# Patient Record
Sex: Male | Born: 1990 | Race: White | Hispanic: No | Marital: Single | Smoking: Never smoker
Health system: Southern US, Community
[De-identification: ages and names within clinical notes are randomized; demographics above are authoritative.]

## PROBLEM LIST (undated history)

## (undated) DIAGNOSIS — F419 Anxiety disorder, unspecified: Secondary | ICD-10-CM

## (undated) DIAGNOSIS — F909 Attention-deficit hyperactivity disorder, unspecified type: Secondary | ICD-10-CM

## (undated) HISTORY — DX: Attention-deficit hyperactivity disorder, unspecified type: F90.9

## (undated) HISTORY — DX: Anxiety disorder, unspecified: F41.9

---
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
## 2012-06-04 ENCOUNTER — Ambulatory Visit: Payer: BC Managed Care – PPO

## 2012-06-04 ENCOUNTER — Ambulatory Visit (INDEPENDENT_AMBULATORY_CARE_PROVIDER_SITE_OTHER): Payer: BC Managed Care – PPO | Admitting: Family Medicine

## 2012-06-04 VITALS — BP 126/71 | HR 59 | Temp 98.1°F | Resp 18 | Ht 66.0 in | Wt 129.8 lb

## 2012-06-04 DIAGNOSIS — M79641 Pain in right hand: Secondary | ICD-10-CM

## 2012-06-04 DIAGNOSIS — S62319A Displaced fracture of base of unspecified metacarpal bone, initial encounter for closed fracture: Secondary | ICD-10-CM

## 2012-06-04 DIAGNOSIS — M25539 Pain in unspecified wrist: Secondary | ICD-10-CM

## 2012-06-04 DIAGNOSIS — S6291XA Unspecified fracture of right wrist and hand, initial encounter for closed fracture: Secondary | ICD-10-CM

## 2012-06-04 DIAGNOSIS — M79609 Pain in unspecified limb: Secondary | ICD-10-CM

## 2012-06-04 MED ORDER — IBUPROFEN 600 MG PO TABS
600.0000 mg | ORAL_TABLET | Freq: Three times a day (TID) | ORAL | Status: DC | PRN
Start: 2012-06-04 — End: 2012-12-08

## 2012-06-04 MED ORDER — TRAMADOL HCL 50 MG PO TABS: 50.0000 mg | ORAL_TABLET | Freq: Three times a day (TID) | ORAL | Status: DC | PRN

## 2012-06-04 NOTE — Progress Notes (Signed)
[facility]:  Office Visit  Chief Complaint:  Chief Complaint  Patient presents with  . Hand Pain    rt hand pain puched car window New Years Eve    HPI: Charles Mahoney is a right handed  22 y.o. male who complains of  A 3 day hisotry of right hand pain s/p punching a car window on New Year's Eve. Did not break any glass. Denies any  prior hand injury/wrist/surgery. Can't put weight on hand, he has weakness and decrease grip, denies numbness/tingling. Has not been able to sleep due to  throbbing pain which is constant, worse at night. He is a Consulting civil engineer at [employer], Business major for now.   History reviewed. No pertinent past medical history. History reviewed. No pertinent past surgical history. History   Social History  . Marital Status: Single    Spouse Name: N/A    Number of Children: N/A  . Years of Education: N/A   Social History Main Topics  . Smoking status: Never Smoker   . Smokeless tobacco: None  . Alcohol Use: Yes     Comment: social  . Drug Use: No  . Sexually Active: None   Other Topics Concern  . None   Social History Narrative  . None   No family history on file. No Known Allergies Prior to Admission medications   Medication Sig Start Date End Date Taking? Authorizing Provider  amphetamine-dextroamphetamine (ADDERALL XR) 15 MG 24 hr capsule Take 15 mg by mouth every morning.   Yes Historical Provider, MD     ROS: The patient denies fevers, chills, night sweats, unintentional weight loss, chest pain, palpitations, wheezing, dyspnea on exertion, nausea, vomiting, abdominal pain, dysuria, hematuria, melena, numbness, weakness, or tingling.   All other systems have been reviewed and were otherwise negative with the exception of those mentioned in the HPI and as above.    PHYSICAL EXAM: Filed Vitals:   06/04/12 1017  BP: 126/71  Pulse: 49  Temp: 98.1 F (36.7 C)  Resp: 18   Filed Vitals:   06/04/12 1017  Height: 5\' 6"  (1.676  m)  Weight: 129 lb 12.8 oz (58.877 kg)   Body mass index is 20.95 kg/(m^2).  General: Alert, no acute distress HEENT:  Normocephalic, atraumatic, oropharynx patent.  Cardiovascular:  Regular rate and rhythm, no rubs murmurs or gallops.  No Carotid bruits, radial pulse intact. No pedal edema.  Respiratory: Clear to auscultation bilaterally.  No wheezes, rales, or rhonchi.  No cyanosis, no use of accessory musculature GI: No organomegaly, abdomen is soft and non-tender, positive bowel sounds.  No masses. Skin: No rashes. Neurologic: Facial musculature symmetric. Psychiatric: Patient is appropriate throughout our interaction. Lymphatic: No cervical lymphadenopathy Musculoskeletal: Gait intact. Right hand-+ edema, ecchymosis on ulnar aspect of dorsum and volar aspect, no deformities, resolving ecchymosis, + radial pulse, decrease ROM due to pain to full flexion/extensions/abduction of 4th and 5th fingers. Sensation intact, 4/5 strength in 4/5th digits.  Right wrist exam-normal    LABS: No results found for this or any previous visit.   EKG/XRAY:   Primary read interpreted by Dr. Conley Rolls at [facility]. No wrist fractures/subluxation Displaced 5th MCP base fracture   ASSESSMENT/PLAN: Encounter Diagnoses  Name Primary?  . Wrist pain, acute Yes  . Hand pain, right   . Hand fracture, right    Displaced 5th MCP base fracture Rx Tramadol and IBuprofen Splint placed, patient declined sling Referred to [facility] Dr. Dion Saucier for Monday AM  walkin appt School note given for 2 days off 10/6 through 03/07/2013. I am not sure if he is planning to go to the ortho referral since he is a Consulting civil engineer at [employer] and he is supposed to resume classes on Monday. I advise him the improtance of getting that fracture evaluated by surgeon as soon as possible. F/u prn    Lalani Winkles PHUONG, DO 06/04/2012 11:09 AM

## 2012-12-08 ENCOUNTER — Ambulatory Visit: Payer: BC Managed Care – PPO

## 2012-12-08 ENCOUNTER — Ambulatory Visit (INDEPENDENT_AMBULATORY_CARE_PROVIDER_SITE_OTHER): Payer: BC Managed Care – PPO | Admitting: Internal Medicine

## 2012-12-08 VITALS — BP 118/70 | HR 72 | Temp 98.8°F | Resp 16 | Ht 67.0 in | Wt 130.0 lb

## 2012-12-08 DIAGNOSIS — M25521 Pain in right elbow: Secondary | ICD-10-CM

## 2012-12-08 DIAGNOSIS — M25529 Pain in unspecified elbow: Secondary | ICD-10-CM

## 2012-12-08 MED ORDER — HYDROCODONE-ACETAMINOPHEN 5-325 MG PO TABS: 1.0000 | ORAL_TABLET | Freq: Three times a day (TID) | ORAL | Status: DC | PRN

## 2012-12-08 NOTE — Progress Notes (Signed)
  Subjective:    Patient ID: Charles Mahoney, male    DOB: Apr 20, 1991, 22 y.o.   MRN: [id number]  HPI 22 year old male presents with right elbow pain s/p injury while playing basketball yesterday.  States he fell on concrete and struck the lateral aspect of his elbow in the ground.  Admits it felt ok right after the injury, but about 2 hours after it "locked up" and he was unable to fully flex or extend his elbow. Has small abrasion above injury. Since then he has continued to have pain and difficulty with ROM.  Has been wearing a sling which does help with the discomfort.  Denies paresthesias or weakness in his fingers or hand. No injury to his hand or shoulder.  No prior surgeries to the elbow, but when he was young dislocated both elbow's several times.   Patient is otherwise doing well with no other concerns today.     Review of Systems  Constitutional: Negative for fever and chills.  Musculoskeletal: Positive for joint swelling.  Skin: Positive for wound (small abrasion over lateral aspect of elbow).  Neurological: Negative for headaches.       Objective:   Physical Exam  Constitutional: He is oriented to person, place, and time. He appears well-developed and well-nourished.  HENT:  Head: Normocephalic and atraumatic.  Right Ear: External ear normal.  Left Ear: External ear normal.  Eyes: Conjunctivae are normal.  Neck: Normal range of motion.  Cardiovascular: Normal rate.   Pulmonary/Chest: Effort normal.  Musculoskeletal:       Right shoulder: Normal.       Right elbow: He exhibits decreased range of motion (unable to fully extend or flex) and swelling. He exhibits no deformity and no laceration. Tenderness found. Radial head tenderness noted. No medial epicondyle, no lateral epicondyle and no olecranon process tenderness noted.       Left elbow: Normal. Decreased range of motion: unable to fully extend or flex.       Right wrist: Normal.  Patient also has pain with supination  and pronation  Neurological: He is alert and oriented to person, place, and time.  Psychiatric: He has a normal mood and affect. His behavior is normal. Judgment and thought content normal.      [facility] reading (PRIMARY) by  Dr. Perrin Maltese as anterior and posterior fat pads present. Possible nondisplaced radial head fracture.      Assessment & Plan:  Elbow pain, right - Plan: DG Elbow Complete Right, HYDROcodone-acetaminophen (NORCO) 5-325 MG per tablet  Continue to wear sling daily for comfort Recommend Tylenol as directed for pain. May take Norco 5/325 mg at bedtime as needed RTC in 10 days to re x-ray, sooner if symptoms worsen Consider PT if needed for rehab.

## 2012-12-19 ENCOUNTER — Ambulatory Visit: Payer: BC Managed Care – PPO

## 2012-12-19 ENCOUNTER — Ambulatory Visit (INDEPENDENT_AMBULATORY_CARE_PROVIDER_SITE_OTHER): Payer: BC Managed Care – PPO | Admitting: Family Medicine

## 2012-12-19 VITALS — BP 104/66 | HR 72 | Temp 98.2°F | Resp 16 | Ht 67.0 in | Wt 132.0 lb

## 2012-12-19 DIAGNOSIS — S59901D Unspecified injury of right elbow, subsequent encounter: Secondary | ICD-10-CM

## 2012-12-19 DIAGNOSIS — M25521 Pain in right elbow: Secondary | ICD-10-CM

## 2012-12-19 DIAGNOSIS — Z5189 Encounter for other specified aftercare: Secondary | ICD-10-CM

## 2012-12-19 DIAGNOSIS — F909 Attention-deficit hyperactivity disorder, unspecified type: Secondary | ICD-10-CM | POA: Insufficient documentation

## 2012-12-19 DIAGNOSIS — R011 Cardiac murmur, unspecified: Secondary | ICD-10-CM

## 2012-12-19 DIAGNOSIS — M25529 Pain in unspecified elbow: Secondary | ICD-10-CM

## 2012-12-19 NOTE — Patient Instructions (Addendum)
Please come and see [place] in about one weeks for a recheck.  Wear your sling as needed for comfort, but be sure to do range of motion exercises several times a day.  Call your insurance company and ask them about doing an "echocardiogram" to evaluate a heart murmur.  If you would like to go ahead with this give me a call or email and I will set it up.

## 2012-12-19 NOTE — Progress Notes (Signed)
[facility] [address] [place] [address] [id number]  Date:  12/19/2012   Name:  Charles Mahoney   DOB:  November 04, 1990   MRN:  [id number]  PCP:  Nilda Simmer, MD    Chief Complaint: Elbow Injury   History of Present Illness:  Charles Mahoney is a 22 y.o. very pleasant male patient who presents with the following:  Seen here on 7/10 with a right elbow injury- he fell and hit the lateal elbow on a concrete floor.    Right elbow film from 7/10-  RIGHT ELBOW - COMPLETE 3+ VIEW  Comparison: None.  Findings: Frontal, lateral, and bilateral oblique views were  obtained. There is evidence of joint effusion. f no fracture is  visualized on this study. No dislocation. Joint spaces appear  intact.  IMPRESSION: Joint effusion. No fractures seen. The presence of  joint effusion raises concern for subtle nondisplaced fracture.  Joint effusion, however, also could have arthropathic etiology.    He was placed in a sling.   His elbow is now better, except when he tries to fully extend the elbow. He is still using his sling  There are no active problems to display for this patient.   Past Medical History  Diagnosis Date  . ADHD (attention deficit hyperactivity disorder)     No past surgical history on file.  History  Substance Use Topics  . Smoking status: Never Smoker   . Smokeless tobacco: Not on file  . Alcohol Use: Yes     Comment: social    No family history on file.  No Known Allergies  Medication list has been reviewed and updated.  Current Outpatient Prescriptions on File Prior to Visit  Medication Sig Dispense Refill  . amphetamine-dextroamphetamine (ADDERALL XR) 15 MG 24 hr capsule Take 15 mg by mouth every morning.      . clonazePAM (KLONOPIN) 1 MG tablet Take 1 mg by mouth daily.      Marland Kitchen HYDROcodone-acetaminophen (NORCO) 5-325 MG per tablet Take 1 tablet by mouth every 8 (eight) hours as needed for pain.  20 tablet  0   No current  facility-administered medications on file prior to visit.    Review of Systems:  As per HPI- otherwise negative.   Physical Examination: Filed Vitals:   12/19/12 0817  BP: 104/66  Pulse: 72  Temp: 98.2 F (36.8 C)  Resp: 16   Filed Vitals:   12/19/12 0817  Height: 5\' 7"  (1.702 m)  Weight: 132 lb (59.875 kg)   Body mass index is 20.67 kg/(m^2). Ideal Body Weight: Weight in (lb) to have BMI = 25: 159.3  GEN: WDWN, NAD, Non-toxic, A & O x 3 HEENT: Atraumatic, Normocephalic. Neck supple. No masses, No LAD. Ears and Nose: No external deformity. CV: RRR, No G/R. No JVD. No thrill. No extra heart sounds.  He does have a 2/6 systolic murmur PULM: CTA B, no wheezes, crackles, rhonchi. No retractions. No resp. distress. No accessory muscle use. EXTR: No c/c/e NEURO Normal gait.  PSYCH: Normally interactive. Conversant. Not depressed or anxious appearing.  Calm demeanor.  Right elbow: cannot fully extend elbow- can get to about 165 degrees.  He has full flexion, normal pronation and supination but he does have pain with this movement. Normal perfusion  [facility] reading (PRIMARY) by  Dr. Patsy Lager. Right elbow: compared with films from 10 days ago.  No definite fracture, but he does have a small anterior fat pad at the elbow  which could indicate a radial head fracture.    RIGHT ELBOW - COMPLETE 3+ VIEW  Comparison: 12/08/2012  Findings: Again noted is a joint effusion within the right elbow.  On today's study, there is suggestion of lucency through the base  of the coronoid process of the ulna concerning for nondisplaced  fracture. No additional suspicious abnormality.  IMPRESSION:  Continued joint effusion. Suspicion for nondisplaced coronoid  process fracture.  Clinically significant discrepancy from primary report, if  provided: Probable coronoid process fracture. Study will be made a  PRA call report.    Assessment and Plan: Pain in right elbow  Injury of right elbow,  subsequent encounter - Plan: DG Elbow Complete Right  Heart murmur  Placed back in sling and instructed to perform ROM exercises and recheck in one week. However, then received over- read report as above- possible coronoid fracture. Called pt back; he is able to see ortho today.  [facility] is able to see him today. Called pt to let him know, will fax records.  Appreciate [facility]'s care of this patient   Also note heart murmur.  He will consult his insurance company and let me know if he is able to have an echo to try and determine the cause.    Signed Abbe Amsterdam, MD

## 2012-12-30 ENCOUNTER — Telehealth: Payer: Self-pay

## 2012-12-30 DIAGNOSIS — R011 Cardiac murmur, unspecified: Secondary | ICD-10-CM

## 2012-12-30 NOTE — Telephone Encounter (Signed)
Pt states on referral vmail that he and dr copland previously talked about referring pt for ECG. Pt would like to request this referral.  bf

## 2013-01-01 NOTE — Telephone Encounter (Signed)
Received message.  Will order echo- called and let him know

## 2013-01-10 ENCOUNTER — Other Ambulatory Visit (HOSPITAL_COMMUNITY): Payer: Self-pay

## 2013-01-19 ENCOUNTER — Telehealth: Payer: Self-pay | Admitting: Family Medicine

## 2013-01-19 NOTE — Telephone Encounter (Signed)
Patient No Showed for Echocardiogram on 01-10-13.  Called patient to re-shedule with no response.  Will forward this message to Dr. Dallas Schimke.

## 2013-01-23 ENCOUNTER — Encounter: Payer: Self-pay | Admitting: Family Medicine

## 2014-02-02 IMAGING — CR DG ELBOW COMPLETE 3+V*R*
4 series · 4 of 4 positions shown · non-contrast
Comparison: None.

CLINICAL DATA: Pain occulta with motion

RIGHT ELBOW - COMPLETE 3+ VIEW

[AP]
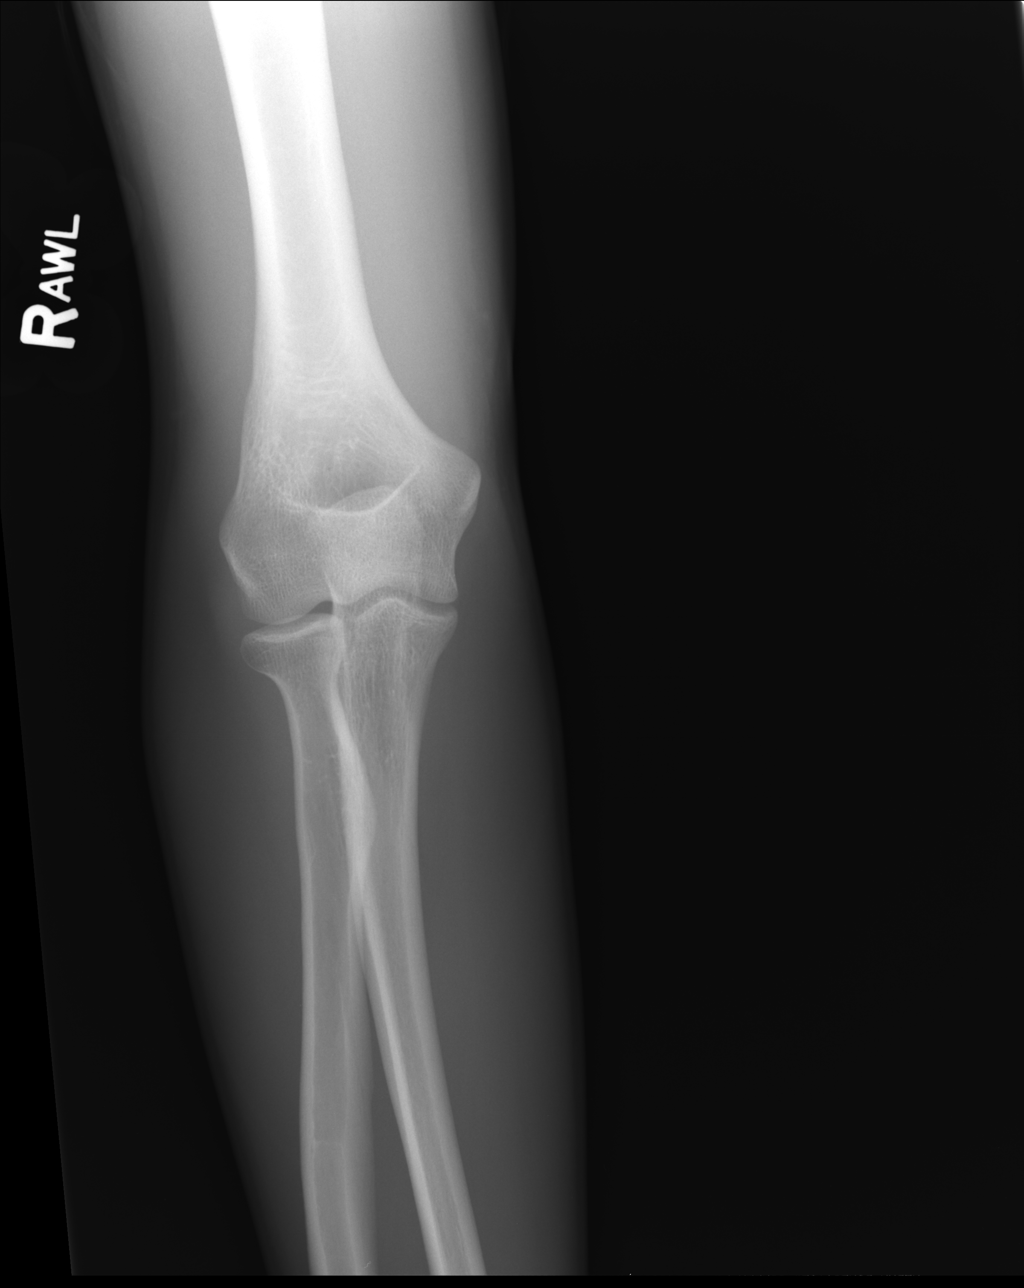

[lateral]
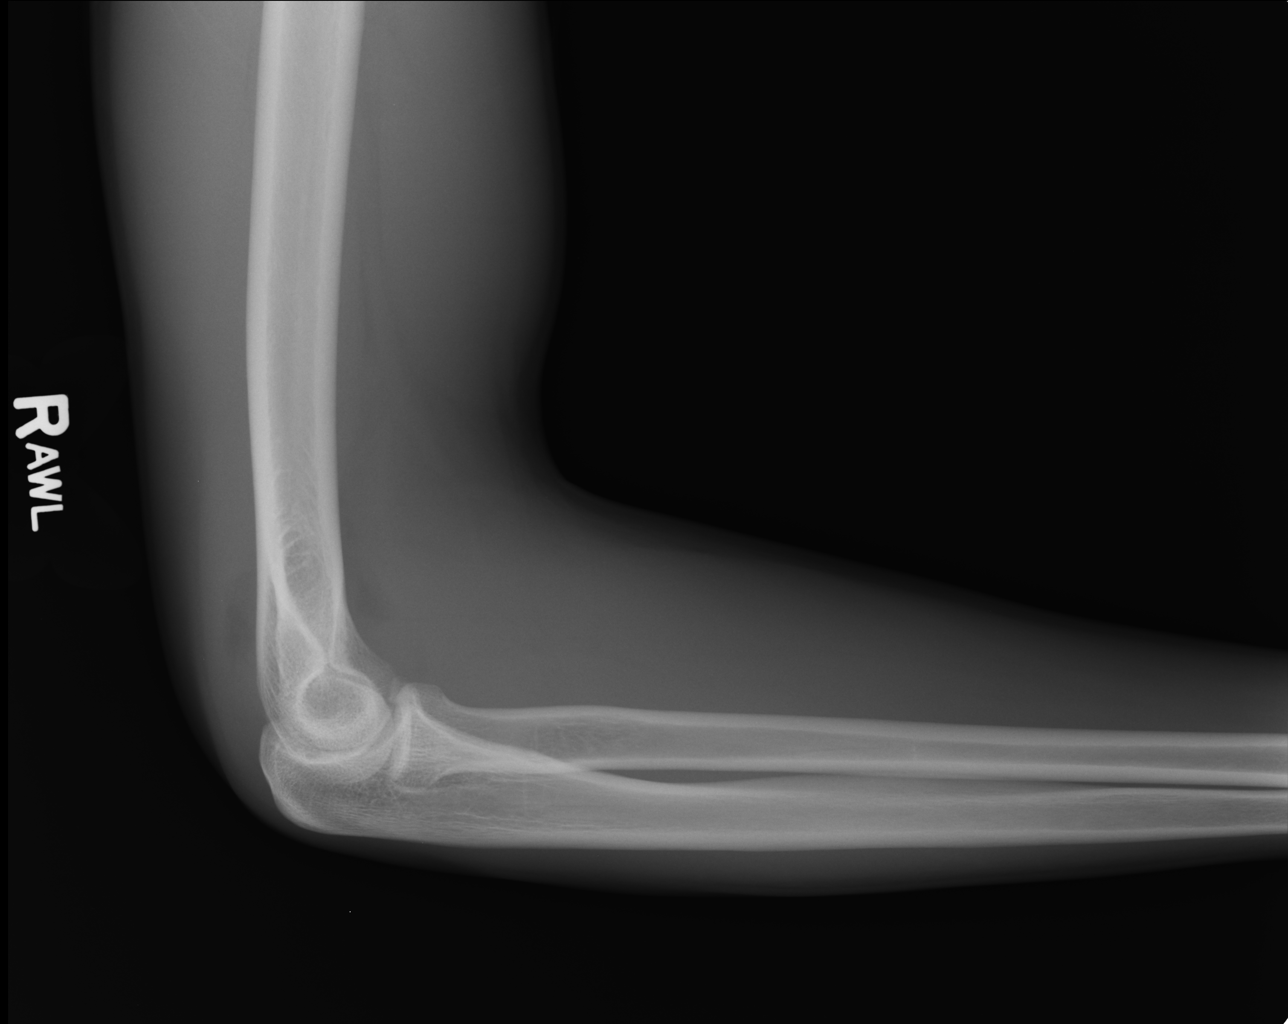

[ap obl ext rot]
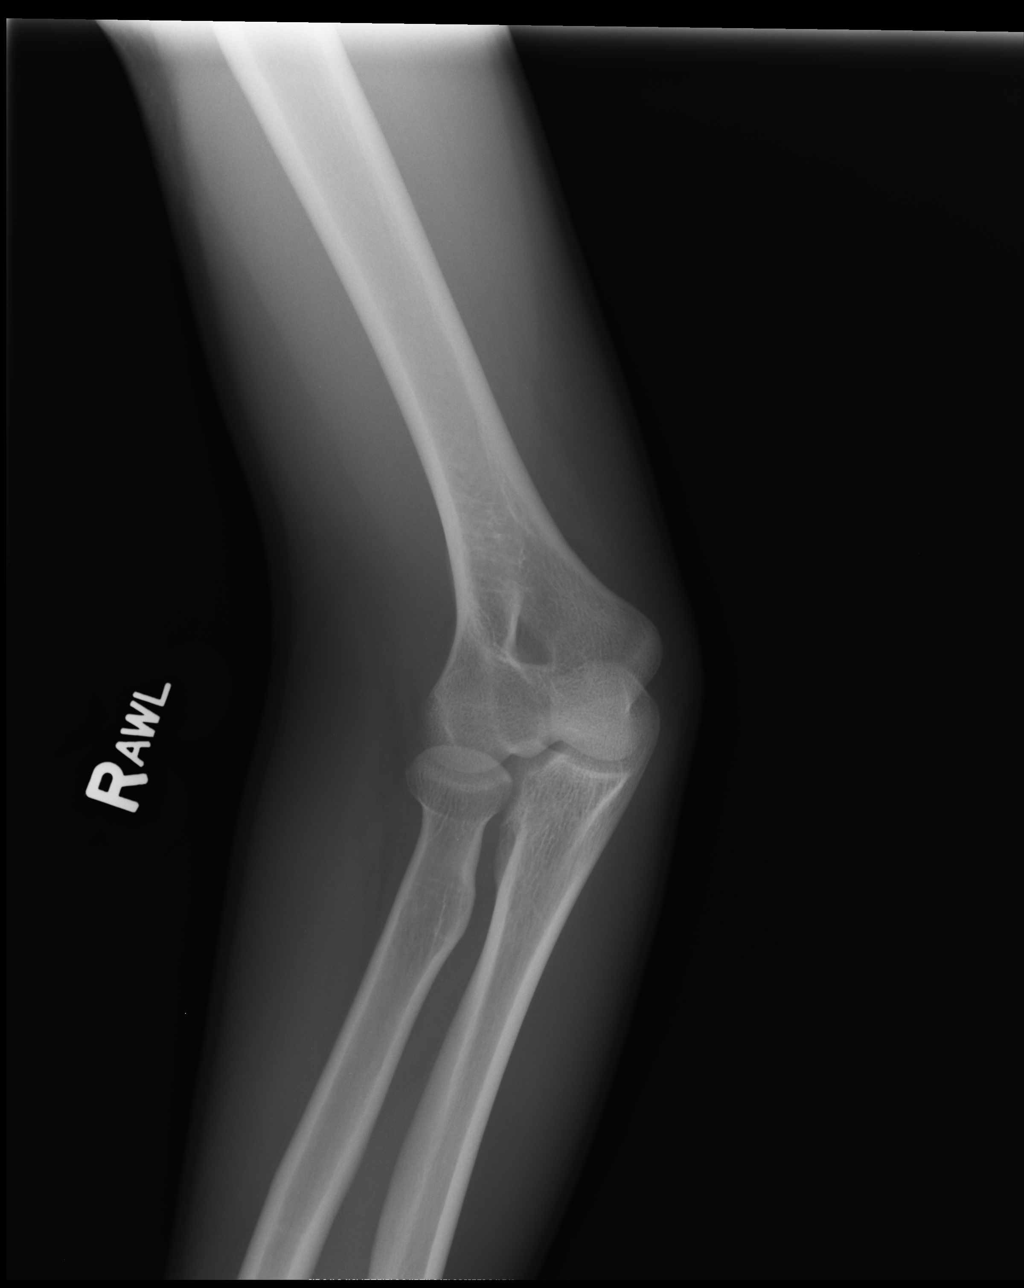

[ap obl int rot]
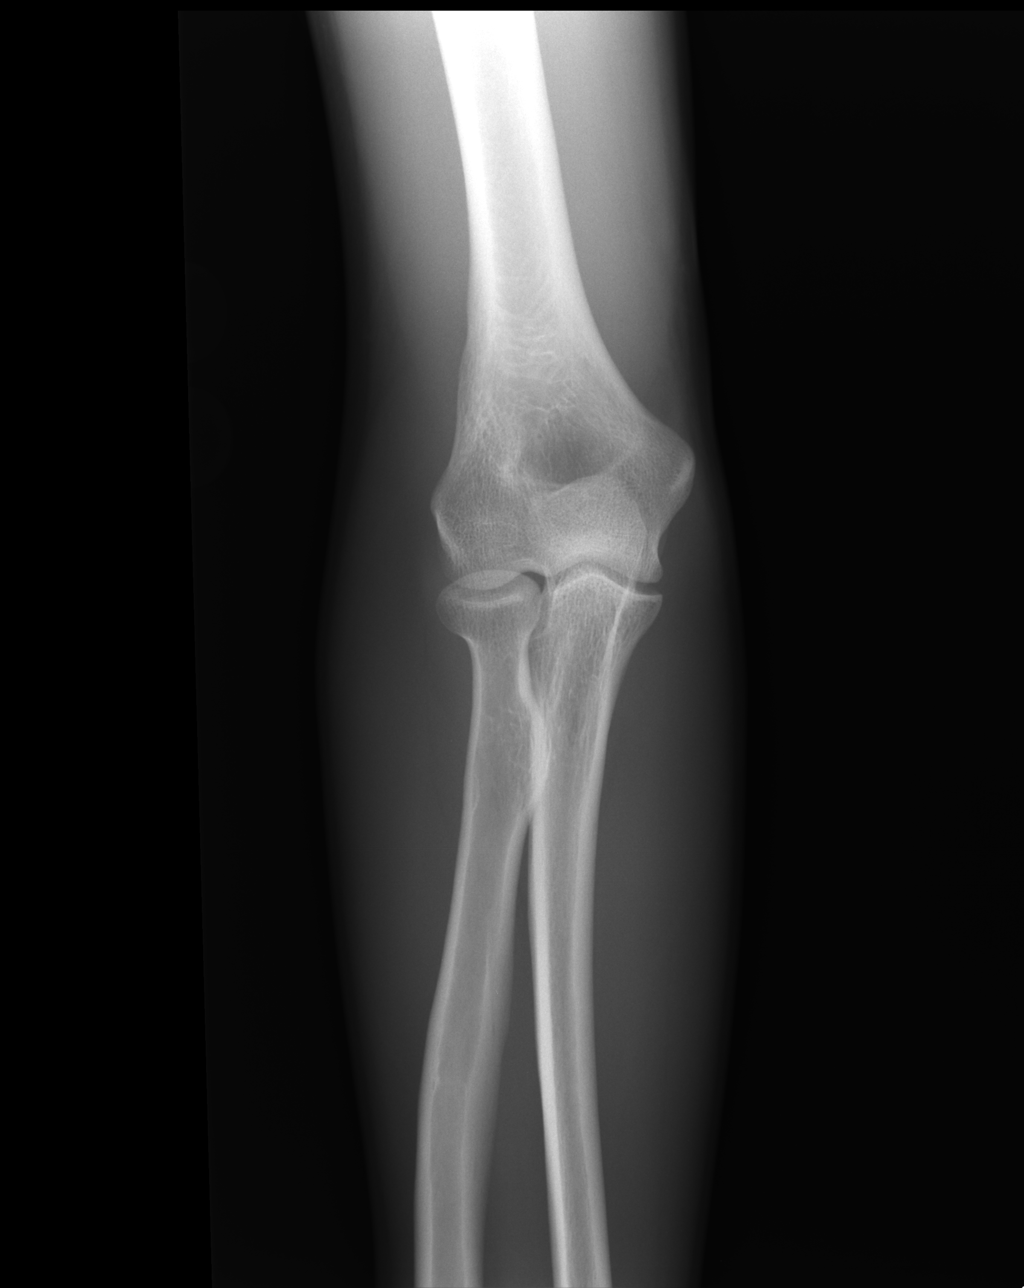

[4 of 4 positions shown; findings below may reference images not displayed]

FINDINGS: Frontal, lateral, and bilateral oblique views were
obtained.  There is evidence of joint effusion.  f no fracture is
visualized on this study.  No dislocation.  Joint spaces appear
intact.
IMPRESSION: Joint effusion.  No fractures seen.  The presence of
joint effusion raises concern for subtle nondisplaced fracture.
Joint effusion, however, also could have arthropathic etiology.

## 2015-06-29 ENCOUNTER — Ambulatory Visit (INDEPENDENT_AMBULATORY_CARE_PROVIDER_SITE_OTHER): Payer: BC Managed Care – PPO | Admitting: Family Medicine

## 2015-06-29 VITALS — BP 124/80 | HR 75 | Temp 98.0°F | Resp 16 | Ht 67.0 in | Wt 134.0 lb

## 2015-06-29 DIAGNOSIS — F411 Generalized anxiety disorder: Secondary | ICD-10-CM | POA: Diagnosis not present

## 2015-06-29 DIAGNOSIS — F901 Attention-deficit hyperactivity disorder, predominantly hyperactive type: Secondary | ICD-10-CM

## 2015-06-29 MED ORDER — FLUOXETINE HCL 20 MG PO TABS: 40.0000 mg | ORAL_TABLET | Freq: Every day | ORAL | Status: DC

## 2015-06-29 MED ORDER — CLONAZEPAM 1 MG PO TABS: 1.0000 mg | ORAL_TABLET | Freq: Two times a day (BID) | ORAL | Status: DC | PRN

## 2015-06-29 MED ORDER — AMPHETAMINE-DEXTROAMPHETAMINE 20 MG PO TABS: 20.0000 mg | ORAL_TABLET | Freq: Three times a day (TID) | ORAL | Status: DC

## 2015-06-29 NOTE — Patient Instructions (Signed)
[facility] Policy for Prescribing Controlled Substances (Revised 04/2012) 1. Prescriptions for controlled substances will be filled by ONE provider at [facility] with whom you have established and developed a plan for your care, including follow-up. 2. You are encouraged to schedule an appointment with your prescriber at our appointment center for follow-up visits whenever possible. 3. If you request a prescription for the controlled substance while at [facility] for an acute problem (with someone other than your regular prescriber), you MAY be given a ONE-TIME prescription for a 30-day supply of the controlled substance, to allow time for you to return to see your regular prescriber for additional prescriptions. 

## 2015-06-29 NOTE — Progress Notes (Signed)
Subjective:    Patient ID: Charles Mahoney, male    DOB: 1990/07/08, 25 y.o.   MRN: [id number]  06/29/2015  Medication Refill   HPI This 25 y.o. male presents for    ADHD:  Diagnosed in fourth grade Dr. Valarie Cones in [facility] psychiatrist.  Prescribed Concerta.  Then switched to Adderall in high school. In college, prescribed Adderall.  Taking Adderall  tid.  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    Work performance is good.    Generalized anxiety disorder/mild depression: chronic issue; very resistant to take medications for years.  Started at age 11.  Prozac  daily.  Clonazepam  bid.  Does not always take bid.  Clonazepam really helps at bedtime most nights; daytime dose 3-4 times per day.  Depending on what is going on.  No suicidal thoughts; no cutting.  Living with mom.  Working on getting apartment.  Not missing work or school. Exercise 5-6 days per week; coffee some; pre-workout with caffeine in it.   [place].  Living in [place]; loved living there but very expensive.  Sleeping well.  Sleeping 6-8 hours per night.  Before left, in school so seeing psychiatrist at [employer]. Followed by psychiatry in [place].  Working on getting an appointment.  Previous Wellbutrin; started Prozac  three months ago.  25-50% improvement in Prozac.     [place] Controlled Substance Registry: 05/29/15 Clonazepam  #60 1 refill [facility] [place], [place] [facility] 05/02/2015 Adderall  #90 Horris Latino [place], [place]  [facility] 05/01/15 Clonazepam  #60 no refills [facility] [place]   Review of Systems  Constitutional: Negative for fever, chills, diaphoresis, activity change, appetite change and fatigue.  Respiratory: Negative for cough and shortness of breath.   Cardiovascular: Negative for chest pain,  palpitations and leg swelling.  Gastrointestinal: Negative for nausea, vomiting, abdominal pain and diarrhea.  Endocrine: Negative for cold intolerance, heat intolerance, polydipsia, polyphagia and polyuria.  Skin: Negative for color change, rash and wound.  Neurological: Negative for dizziness, tremors, seizures, syncope, facial asymmetry, speech difficulty, weakness, light-headedness, numbness and headaches.  Psychiatric/Behavioral: Positive for sleep disturbance and decreased concentration. Negative for suicidal ideas, self-injury and dysphoric mood. The patient is nervous/anxious.     Past Medical History  Diagnosis Date  . ADHD (attention deficit hyperactivity disorder)    History reviewed. No pertinent past surgical history. No Known Allergies Current Outpatient Prescriptions  Medication Sig Dispense Refill  . amphetamine-dextroamphetamine (ADDERALL XR) 15 MG 24 hr capsule Take 15 mg by mouth every morning.    . clonazePAM (KLONOPIN) 1 MG tablet Take 1 mg by mouth daily.    Marland Kitchen HYDROcodone-acetaminophen (NORCO) 5-325 MG per tablet Take 1 tablet by mouth every 8 (eight) hours as needed for pain. (Patient not taking: Reported on 06/29/2015) 20 tablet 0   No current facility-administered medications for this visit.   Social History   Social History  . Marital Status: Single    Spouse Name: N/A  . Number of Children: N/A  . Years of Education: N/A   Occupational History  . Not on file.   Social History Main Topics  . Smoking status: Never Smoker   . Smokeless tobacco: Not on file  . Alcohol Use: Yes     Comment: social  . Drug Use:  No  . Sexual Activity: Not on file   Other Topics Concern  . Not on file   Social History Narrative   History reviewed. No pertinent family history.     Objective:    BP 124/80 mmHg  Pulse 75  Temp(Src) 98 F (36.7 C) (Oral)  Resp 16  Ht  (1.702 m)  Wt 134 lb (60.782 kg)  BMI 20.98 kg/m2  SpO2 96% Physical Exam    Constitutional: He is oriented to person, place, and time. He appears well-developed and well-nourished. No distress.  HENT:  Head: Normocephalic and atraumatic.  Right Ear: External ear normal.  Left Ear: External ear normal.  Nose: Nose normal.  Mouth/Throat: Oropharynx is clear and moist.  Eyes: Conjunctivae and EOM are normal. Pupils are equal, round, and reactive to light.  Neck: Normal range of motion. Neck supple. Carotid bruit is not present. No thyromegaly present.  Cardiovascular: Normal rate, regular rhythm, normal heart sounds and intact distal pulses.  Exam reveals no gallop and no friction rub.   No murmur heard. Pulmonary/Chest: Effort normal and breath sounds normal. He has no wheezes. He has no rales.  Abdominal: Soft. Bowel sounds are normal. He exhibits no distension and no mass. There is no tenderness. There is no rebound and no guarding.  Lymphadenopathy:    He has no cervical adenopathy.  Neurological: He is alert and oriented to person, place, and time. No cranial nerve deficit.  Skin: Skin is warm and dry. No rash noted. He is not diaphoretic.  Psychiatric: He has a normal mood and affect. His behavior is normal.  Nursing note and vitals reviewed.  No results found for this or any previous visit.     Assessment & Plan:  No diagnosis found.  No orders of the defined types were placed in this encounter.   No orders of the defined types were placed in this encounter.    No Follow-up on file.    Kieara Schwark Paulita Fujita, M.D. [facility] [address] [place], [place]  [address] [phone] phone [phone] fax

## 2015-08-01 ENCOUNTER — Telehealth: Payer: Self-pay

## 2015-08-01 NOTE — Telephone Encounter (Signed)
Pt req. 3 mo refills for   FLUoxetine (PROZAC) 20 MG tablet [[id number]]   amphetamine-dextroamphetamine (ADDERALL) 20 MG tablet     clonazePAM (KLONOPIN) 1 MG tablet  Last OV 06/29/15--- pt req. cb from Dr. Katrinka Blazing  [phone] Please call

## 2015-08-05 MED ORDER — FLUOXETINE HCL 20 MG PO TABS: 40.0000 mg | ORAL_TABLET | Freq: Every day | ORAL | Status: DC

## 2015-08-05 NOTE — Telephone Encounter (Signed)
SPoke with pt about message and he verbalized understanding. One month supply of Klonopin and Adderall.

## 2015-08-05 NOTE — Telephone Encounter (Signed)
Call --- 1. Sent in a 3 month supply [facility].  2. I do not prescribe 3 month supply of Klonopin or Adderall because they are controlled substances; I will only prescribe a one month supply.

## 2015-08-09 MED ORDER — AMPHETAMINE-DEXTROAMPHETAMINE 20 MG PO TABS: 20.0000 mg | ORAL_TABLET | Freq: Three times a day (TID) | ORAL | Status: DC

## 2015-08-09 MED ORDER — CLONAZEPAM 1 MG PO TABS: 1.0000 mg | ORAL_TABLET | Freq: Two times a day (BID) | ORAL | Status: DC | PRN

## 2015-08-09 NOTE — Telephone Encounter (Signed)
Please call in refill of Clonazepam as approved. 

## 2015-08-09 NOTE — Telephone Encounter (Signed)
PA pool --- please refill/print/sign refill of Adderall for patient in my absence this weekend.  I refilled and forgot to sign today.

## 2015-08-09 NOTE — Telephone Encounter (Signed)
Meds ordered this encounter  Medications  . amphetamine-dextroamphetamine (ADDERALL) 20 MG tablet    Sig: Take 1 tablet (20 mg total) by mouth 3 (three) times daily.    Dispense:  90 tablet    Refill:  0    Order Specific Question:  Supervising Provider    Answer:  DOOLITTLE, ROBERT P [[id number]]

## 2015-09-03 ENCOUNTER — Other Ambulatory Visit: Payer: Self-pay | Admitting: Family Medicine

## 2015-09-05 NOTE — Telephone Encounter (Signed)
Faxed

## 2015-09-05 NOTE — Telephone Encounter (Signed)
Please call in or fax refill of Clonazepam as approved. 

## 2015-09-12 ENCOUNTER — Other Ambulatory Visit: Payer: Self-pay

## 2015-09-12 NOTE — Telephone Encounter (Signed)
Pt is needing a refill on adderall and klonapin   Best number [phone]

## 2015-09-13 MED ORDER — AMPHETAMINE-DEXTROAMPHETAMINE 20 MG PO TABS: 20.0000 mg | ORAL_TABLET | Freq: Three times a day (TID) | ORAL | Status: DC

## 2015-09-13 NOTE — Telephone Encounter (Signed)
Done

## 2015-09-13 NOTE — Telephone Encounter (Signed)
Notified pt on VM. 

## 2015-09-13 NOTE — Telephone Encounter (Signed)
Ref[facility] of [facility] 09[facility]; will be due to fill [facility].  Can you please refill Adderall for patient since I am out of the office until Monday?  Thanks so much!

## 2023-06-30 DIAGNOSIS — Z1329 Encounter for screening for other suspected endocrine disorder: Secondary | ICD-10-CM | POA: Diagnosis not present

## 2023-06-30 DIAGNOSIS — Z125 Encounter for screening for malignant neoplasm of prostate: Secondary | ICD-10-CM | POA: Diagnosis not present

## 2023-06-30 DIAGNOSIS — E291 Testicular hypofunction: Secondary | ICD-10-CM | POA: Diagnosis not present

## 2024-01-13 DIAGNOSIS — M549 Dorsalgia, unspecified: Secondary | ICD-10-CM | POA: Diagnosis not present

## 2024-01-18 DIAGNOSIS — R030 Elevated blood-pressure reading, without diagnosis of hypertension: Secondary | ICD-10-CM | POA: Diagnosis not present

## 2024-01-18 DIAGNOSIS — Z6826 Body mass index (BMI) 26.0-26.9, adult: Secondary | ICD-10-CM | POA: Diagnosis not present

## 2024-01-18 DIAGNOSIS — L03113 Cellulitis of right upper limb: Secondary | ICD-10-CM | POA: Diagnosis not present

## 2024-06-17 ENCOUNTER — Inpatient Hospital Stay (HOSPITAL_COMMUNITY)
Admission: EM | Admit: 2024-06-17 | Discharge: 2024-06-19 | DRG: 897 | Disposition: A | Discharge status: Home / Self Care | Attending: Internal Medicine | Admitting: Internal Medicine

## 2024-06-17 ENCOUNTER — Encounter (HOSPITAL_COMMUNITY): Payer: Self-pay

## 2024-06-17 ENCOUNTER — Emergency Department (HOSPITAL_COMMUNITY)

## 2024-06-17 ENCOUNTER — Other Ambulatory Visit: Payer: Self-pay

## 2024-06-17 DIAGNOSIS — I959 Hypotension, unspecified: Secondary | ICD-10-CM | POA: Diagnosis present

## 2024-06-17 DIAGNOSIS — F1010 Alcohol abuse, uncomplicated: Secondary | ICD-10-CM | POA: Diagnosis present

## 2024-06-17 DIAGNOSIS — F1910 Other psychoactive substance abuse, uncomplicated: Secondary | ICD-10-CM | POA: Diagnosis present

## 2024-06-17 DIAGNOSIS — Z810 Family history of intellectual disabilities: Secondary | ICD-10-CM

## 2024-06-17 DIAGNOSIS — R400 Somnolence: Secondary | ICD-10-CM | POA: Diagnosis present

## 2024-06-17 DIAGNOSIS — Z560 Unemployment, unspecified: Secondary | ICD-10-CM

## 2024-06-17 DIAGNOSIS — Z91148 Patient's other noncompliance with medication regimen for other reason: Secondary | ICD-10-CM

## 2024-06-17 DIAGNOSIS — Y906 Blood alcohol level of 120-199 mg/100 ml: Secondary | ICD-10-CM | POA: Diagnosis present

## 2024-06-17 DIAGNOSIS — Z818 Family history of other mental and behavioral disorders: Secondary | ICD-10-CM

## 2024-06-17 DIAGNOSIS — F19929 Other psychoactive substance use, unspecified with intoxication, unspecified: Secondary | ICD-10-CM | POA: Diagnosis present

## 2024-06-17 DIAGNOSIS — E7439 Other disorders of intestinal carbohydrate absorption: Secondary | ICD-10-CM | POA: Diagnosis present

## 2024-06-17 DIAGNOSIS — R001 Bradycardia, unspecified: Secondary | ICD-10-CM | POA: Diagnosis present

## 2024-06-17 DIAGNOSIS — F909 Attention-deficit hyperactivity disorder, unspecified type: Secondary | ICD-10-CM | POA: Diagnosis present

## 2024-06-17 DIAGNOSIS — R233 Spontaneous ecchymoses: Secondary | ICD-10-CM | POA: Diagnosis present

## 2024-06-17 DIAGNOSIS — Z79899 Other long term (current) drug therapy: Secondary | ICD-10-CM

## 2024-06-17 DIAGNOSIS — F1990 Other psychoactive substance use, unspecified, uncomplicated: Principal | ICD-10-CM

## 2024-06-17 DIAGNOSIS — F430 Acute stress reaction: Secondary | ICD-10-CM | POA: Diagnosis present

## 2024-06-17 DIAGNOSIS — F11220 Opioid dependence with intoxication, uncomplicated: Principal | ICD-10-CM | POA: Diagnosis present

## 2024-06-17 DIAGNOSIS — F32A Depression, unspecified: Secondary | ICD-10-CM | POA: Diagnosis present

## 2024-06-17 DIAGNOSIS — F1210 Cannabis abuse, uncomplicated: Secondary | ICD-10-CM | POA: Diagnosis present

## 2024-06-17 LAB — CBC WITH DIFFERENTIAL/PLATELET
Abs Immature Granulocytes: 0.01 K/uL (ref 0.00–0.07)
Basophils Absolute: 0 K/uL (ref 0.0–0.1)
Basophils Relative: 1 %
Eosinophils Absolute: 0.1 K/uL (ref 0.0–0.5)
Eosinophils Relative: 2 %
HCT: 39.8 % (ref 39.0–52.0)
Hemoglobin: 13.5 g/dL (ref 13.0–17.0)
Immature Granulocytes: 0 %
Lymphocytes Relative: 20 %
Lymphs Abs: 1.3 K/uL (ref 0.7–4.0)
MCH: 31.3 pg (ref 26.0–34.0)
MCHC: 33.9 g/dL (ref 30.0–36.0)
MCV: 92.3 fL (ref 80.0–100.0)
Monocytes Absolute: 0.4 K/uL (ref 0.1–1.0)
Monocytes Relative: 7 %
Neutro Abs: 4.7 K/uL (ref 1.7–7.7)
Neutrophils Relative %: 70 %
Platelets: 258 K/uL (ref 150–400)
RBC: 4.31 MIL/uL (ref 4.22–5.81)
RDW: 13.3 % (ref 11.5–15.5)
WBC: 6.6 K/uL (ref 4.0–10.5)
nRBC: 0 % (ref 0.0–0.2)

## 2024-06-17 LAB — COMPREHENSIVE METABOLIC PANEL WITH GFR
ALT: 23 U/L (ref 0–44)
AST: 31 U/L (ref 15–41)
Albumin: 4.4 g/dL (ref 3.5–5.0)
Alkaline Phosphatase: 31 U/L — ABNORMAL LOW (ref 38–126)
Anion gap: 13 (ref 5–15)
BUN: 10 mg/dL (ref 6–20)
CO2: 27 mmol/L (ref 22–32)
Calcium: 9.7 mg/dL (ref 8.9–10.3)
Chloride: 102 mmol/L (ref 98–111)
Creatinine, Ser: 0.92 mg/dL (ref 0.61–1.24)
GFR, Estimated: >60 mL/min
Glucose, Bld: 98 mg/dL (ref 70–99)
Potassium: 4.4 mmol/L (ref 3.5–5.1)
Sodium: 142 mmol/L (ref 135–145)
Total Bilirubin: 0.4 mg/dL (ref 0.0–1.2)
Total Protein: 7.9 g/dL (ref 6.5–8.1)

## 2024-06-17 LAB — ACETAMINOPHEN LEVEL: Acetaminophen (Tylenol), Serum: <10 ug/mL — ABNORMAL LOW (ref 10–30)

## 2024-06-17 LAB — SALICYLATE LEVEL: Salicylate Lvl: <7 mg/dL — ABNORMAL LOW (ref 7.0–30.0)

## 2024-06-17 LAB — MAGNESIUM: Magnesium: 2.3 mg/dL (ref 1.7–2.4)

## 2024-06-17 LAB — ETHANOL: Alcohol, Ethyl (B): 126 mg/dL — ABNORMAL HIGH

## 2024-06-17 MED ORDER — LORAZEPAM 2 MG/ML IJ SOLN
1.0000 mg | INTRAMUSCULAR | Status: DC | PRN
  Administered 2024-06-18 (×3): 2 mg via INTRAVENOUS
  Filled 2024-06-17 (×3): qty 1

## 2024-06-17 MED ORDER — THIAMINE MONONITRATE 100 MG PO TABS
100.0000 mg | ORAL_TABLET | Freq: Every day | ORAL | Status: DC
  Administered 2024-06-19: 100 mg via ORAL
  Filled 2024-06-17 (×2): qty 1

## 2024-06-17 MED ORDER — FOLIC ACID 1 MG PO TABS
1.0000 mg | ORAL_TABLET | Freq: Every day | ORAL | Status: DC
  Administered 2024-06-18 – 2024-06-19 (×2): 1 mg via ORAL
  Filled 2024-06-17 (×2): qty 1

## 2024-06-17 MED ORDER — THIAMINE HCL 100 MG/ML IJ SOLN
100.0000 mg | Freq: Every day | INTRAMUSCULAR | Status: DC
  Administered 2024-06-18: 100 mg via INTRAVENOUS
  Filled 2024-06-17: qty 2

## 2024-06-17 MED ORDER — HALOPERIDOL LACTATE 5 MG/ML IJ SOLN
5.0000 mg | Freq: Once | INTRAMUSCULAR | Status: DC
  Filled 2024-06-17: qty 1

## 2024-06-17 MED ORDER — DIPHENHYDRAMINE HCL 50 MG/ML IJ SOLN
50.0000 mg | Freq: Once | INTRAMUSCULAR | Status: AC
  Administered 2024-06-17: 50 mg via INTRAMUSCULAR
  Filled 2024-06-17: qty 1

## 2024-06-17 MED ORDER — HALOPERIDOL LACTATE 5 MG/ML IJ SOLN: 5.0000 mg | Freq: Once | INTRAMUSCULAR | Status: DC

## 2024-06-17 MED ORDER — NICOTINE 21 MG/24HR TD PT24
21.0000 mg | MEDICATED_PATCH | Freq: Once | TRANSDERMAL | Status: AC
  Administered 2024-06-17: 21 mg via TRANSDERMAL
  Filled 2024-06-17: qty 1

## 2024-06-17 MED ORDER — ZIPRASIDONE MESYLATE 20 MG IM SOLR
20.0000 mg | Freq: Once | INTRAMUSCULAR | Status: AC
  Administered 2024-06-17: 20 mg via INTRAMUSCULAR
  Filled 2024-06-17: qty 20

## 2024-06-17 MED ORDER — ADULT MULTIVITAMIN W/MINERALS CH: 1.0000 | ORAL_TABLET | Freq: Every day | ORAL | Status: DC

## 2024-06-17 MED ORDER — LORAZEPAM 1 MG PO TABS
1.0000 mg | ORAL_TABLET | ORAL | Status: DC | PRN
  Administered 2024-06-18: 2 mg via ORAL
  Administered 2024-06-19 (×2): 1 mg via ORAL
  Filled 2024-06-17: qty 1
  Filled 2024-06-17: qty 2
  Filled 2024-06-17: qty 1

## 2024-06-17 NOTE — ED Notes (Signed)
 Patient still yelling and trying to get out restraints

## 2024-06-17 NOTE — ED Provider Notes (Signed)
" °  Physical Exam  BP (!) 106/59 (BP Location: Left Arm)   Pulse (!) 54   Temp 98.7 F (37.1 C) (Oral)   Resp 18   SpO2 94%   Physical Exam  Procedures  Procedures  ED Course / MDM   Clinical Course as of 06/17/24 2351  Sat Jun 17, 2024  1641 Called Manus Blush, patient's father,  [RR]  1647 Florence Register, patient's girlfriend [RR]  [employer] now. [RR]  1652 CNS depression, 6 hours from ingestion should see improvement. Lindie [RR]    Clinical Course User Index [RR] Bernis Ernst, PA-C   Medical Decision Making Amount and/or Complexity of Data Reviewed Labs: ordered. Radiology: ordered.  Risk OTC drugs. Prescription drug management. Decision regarding hospitalization.   Pending urine collection for UDS - admission for observation. Discussed with Dr. Debby who wants UDS prior to admission Designer benzo, gabapentin, suboxone , ETOH  Needs medical admission for obs Poison control - benzo obs  UDS resulted per request of admitting MD, Dr. Debby. She is made aware of test. Patient pending admission to her service.       Odell Balls, PA-C 06/18/24 9349    Fredia Rosette Kirsch, MD 06/18/24 1631  "

## 2024-06-17 NOTE — ED Notes (Signed)
 Patient Ivc and is still trying to leave and get out restraints

## 2024-06-17 NOTE — ED Triage Notes (Signed)
 Pt escorted to ED by [employer] from home with IVC.  Pt is very lethargic on arrival pt states he takes Suboxone  and may be possibly going through withdrawals.

## 2024-06-17 NOTE — ED Notes (Signed)
 Pt urinated in bed unable to collect sample, RN and NT cleaned pt up

## 2024-06-17 NOTE — ED Notes (Signed)
 Patient still yelling and is restless and trying to get out restraints

## 2024-06-17 NOTE — ED Provider Notes (Signed)
 " [place] EMERGENCY DEPARTMENT AT [facility] Provider Note   CSN: [id number] Arrival date & time: 06/17/24  1607     Patient presents with: IVC   Charles Mahoney is a 34 y.o. male.  {Add pertinent medical, surgical, social history, OB history to HPI:32947} HPI     Prior to Admission medications  Medication Sig Start Date End Date Taking? Authorizing Provider  amphetamine -dextroamphetamine  (ADDERALL) 20 MG tablet Take 1 tablet (20 mg total) by mouth 3 (three) times daily. 09/13/15   Weber, Lauraine CROME, PA-C  clonazePAM  (KLONOPIN ) 1 MG tablet TAKE (1) TABLET TWICE DAILY AS NEEDED FOR ANXIETY. 09/05/15   Smith, Kristi M, MD  FLUoxetine  (PROZAC ) 20 MG tablet Take 2 tablets (40 mg total) by mouth daily. 08/05/15   Claudene Rayfield HERO, MD  HYDROcodone -acetaminophen  (NORCO) 5-325 MG per tablet Take 1 tablet by mouth every 8 (eight) hours as needed for pain. Patient not taking: Reported on 06/29/2015 12/08/12   [name] HERO, PA-C    Allergies: Patient has no known allergies.    Review of Systems  Updated Vital Signs BP 120/87 (BP Location: Right Arm)   Pulse 85   Temp 98.2 F (36.8 C) (Oral)   Resp 15   SpO2 92%   Physical Exam  (all labs ordered are listed, but only abnormal results are displayed) Labs Reviewed  ETHANOL - Abnormal; Notable for the following components:      Result Value   Alcohol, Ethyl (B) 126 (*)    All other components within normal limits  COMPREHENSIVE METABOLIC PANEL WITH GFR - Abnormal; Notable for the following components:   Alkaline Phosphatase 31 (*)    All other components within normal limits  ACETAMINOPHEN  LEVEL - Abnormal; Notable for the following components:   Acetaminophen  (Tylenol ), Serum <10 (*)    All other components within normal limits  SALICYLATE LEVEL - Abnormal; Notable for the following components:   Salicylate Lvl <7.0 (*)    All other components within normal limits  CBC WITH DIFFERENTIAL/PLATELET  MAGNESIUM  URINE DRUG  SCREEN  URINALYSIS, ROUTINE W REFLEX MICROSCOPIC    EKG: None  Radiology: CT Head Wo Contrast Result Date: 06/17/2024 EXAM: CT HEAD WITHOUT CONTRAST 06/17/2024 08:11:53 PM TECHNIQUE: CT of the head was performed without the administration of intravenous contrast. Automated exposure control, iterative reconstruction, and/or weight based adjustment of the mA/kV was utilized to reduce the radiation dose to as low as reasonably achievable. COMPARISON: None available. CLINICAL HISTORY: Mental status change, unknown cause. FINDINGS: BRAIN AND VENTRICLES: No acute hemorrhage. No evidence of acute infarct. No hydrocephalus. No extra-axial collection. No mass effect or midline shift. ORBITS: No acute abnormality. SINUSES: No acute abnormality. SOFT TISSUES AND SKULL: No acute soft tissue abnormality. No skull fracture. IMPRESSION: 1. No acute intracranial abnormality. Electronically signed by: Pinkie Pebbles MD 06/17/2024 08:16 PM EST RP Workstation: [id number]    {Document cardiac monitor, telemetry assessment procedure when appropriate:32947} Procedures   Medications Ordered in the ED  haloperidol  lactate (HALDOL ) injection 5 mg (has no administration in time range)  nicotine  (NICODERM CQ  - dosed in mg/24 hours) patch 21 mg (has no administration in time range)  ziprasidone  (GEODON ) injection 20 mg (20 mg Intramuscular Given 06/17/24 1917)  diphenhydrAMINE  (BENADRYL ) injection 50 mg (50 mg Intramuscular Given 06/17/24 1917)    Clinical Course as of 06/17/24 2025  Sat Jun 17, 2024  1641 Called Manus Blush, patient's father,  [RR]  1647 Florence Register, patient's girlfriend [RR]  1651 Calling Poison Control now. [RR]  1652 CNS depression, 6 hours from ingestion should see improvement. Lindie [RR]    Clinical Course User Index [RR] Bernis Ernst, PA-C   {Click here for ABCD2, HEART and other calculators REFRESH Note before signing:1}                              Medical Decision  Making Amount and/or Complexity of Data Reviewed Labs: ordered. Radiology: ordered.  Risk OTC drugs. Prescription drug management.   ***  {Document critical care time when appropriate  Document review of labs and clinical decision tools ie CHADS2VASC2, etc  Document your independent review of radiology images and any outside records  Document your discussion with family members, caretakers and with consultants  Document social determinants of health affecting pt's care  Document your decision making why or why not admission, treatments were needed:32947:::1}   Final diagnoses:  None    ED Discharge Orders     None        "

## 2024-06-17 NOTE — ED Notes (Signed)
 Patient still trying to get restraints off

## 2024-06-17 NOTE — ED Notes (Signed)
 Pt bit off mittens and styrofoam went everywhere. RN attempted to fix soft restraints and pt began to shake styrofoam on nurse to distract them. Pt unsuccessful.

## 2024-06-17 NOTE — ED Notes (Signed)
 Patient still yelling and trying to remove his restraints

## 2024-06-18 ENCOUNTER — Inpatient Hospital Stay (HOSPITAL_COMMUNITY)

## 2024-06-18 DIAGNOSIS — R9431 Abnormal electrocardiogram [ECG] [EKG]: Secondary | ICD-10-CM | POA: Diagnosis not present

## 2024-06-18 DIAGNOSIS — F19929 Other psychoactive substance use, unspecified with intoxication, unspecified: Secondary | ICD-10-CM | POA: Diagnosis present

## 2024-06-18 LAB — URINE DRUG SCREEN
Amphetamines: NEGATIVE
Barbiturates: NEGATIVE
Benzodiazepines: POSITIVE — AB
Cocaine: NEGATIVE
Fentanyl: NEGATIVE
Methadone Scn, Ur: NEGATIVE
Opiates: NEGATIVE
Tetrahydrocannabinol: POSITIVE — AB

## 2024-06-18 LAB — COMPREHENSIVE METABOLIC PANEL WITH GFR
ALT: 27 U/L (ref 0–44)
AST: 39 U/L (ref 15–41)
Albumin: 4.4 g/dL (ref 3.5–5.0)
Alkaline Phosphatase: 29 U/L — ABNORMAL LOW (ref 38–126)
Anion gap: 13 (ref 5–15)
BUN: 16 mg/dL (ref 6–20)
CO2: 29 mmol/L (ref 22–32)
Calcium: 10 mg/dL (ref 8.9–10.3)
Chloride: 100 mmol/L (ref 98–111)
Creatinine, Ser: 0.95 mg/dL (ref 0.61–1.24)
GFR, Estimated: >60 mL/min
Glucose, Bld: 116 mg/dL — ABNORMAL HIGH (ref 70–99)
Potassium: 4.1 mmol/L (ref 3.5–5.1)
Sodium: 141 mmol/L (ref 135–145)
Total Bilirubin: 0.8 mg/dL (ref 0.0–1.2)
Total Protein: 7.8 g/dL (ref 6.5–8.1)

## 2024-06-18 LAB — URINALYSIS, ROUTINE W REFLEX MICROSCOPIC
Bilirubin Urine: NEGATIVE
Glucose, UA: NEGATIVE mg/dL
Hgb urine dipstick: NEGATIVE
Ketones, ur: NEGATIVE mg/dL
Leukocytes,Ua: NEGATIVE
Nitrite: NEGATIVE
Protein, ur: NEGATIVE mg/dL
Specific Gravity, Urine: 1.005 (ref 1.005–1.030)
pH: 6 (ref 5.0–8.0)

## 2024-06-18 LAB — CBC
HCT: 41.5 % (ref 39.0–52.0)
Hemoglobin: 14.2 g/dL (ref 13.0–17.0)
MCH: 31.4 pg (ref 26.0–34.0)
MCHC: 34.2 g/dL (ref 30.0–36.0)
MCV: 91.8 fL (ref 80.0–100.0)
Platelets: 266 K/uL (ref 150–400)
RBC: 4.52 MIL/uL (ref 4.22–5.81)
RDW: 13.8 % (ref 11.5–15.5)
WBC: 8.7 K/uL (ref 4.0–10.5)
nRBC: 0 % (ref 0.0–0.2)

## 2024-06-18 LAB — ECHOCARDIOGRAM COMPLETE
Area-P 1/2: 3.44 cm2
S' Lateral: 3.6 cm
Single Plane A4C EF: 62.8 %

## 2024-06-18 LAB — HEMOGLOBIN A1C
Hgb A1c MFr Bld: 5.7 % — ABNORMAL HIGH (ref 4.8–5.6)
Mean Plasma Glucose: 116.89 mg/dL

## 2024-06-18 LAB — TSH: TSH: 0.858 u[IU]/mL (ref 0.350–4.500)

## 2024-06-18 LAB — HIV ANTIBODY (ROUTINE TESTING W REFLEX): HIV Screen 4th Generation wRfx: NONREACTIVE

## 2024-06-18 MED ORDER — SODIUM CHLORIDE 0.9 % IV SOLN: INTRAVENOUS | Status: DC

## 2024-06-18 MED ORDER — LACTATED RINGERS IV SOLN: INTRAVENOUS | Status: AC

## 2024-06-18 MED ORDER — ACETAMINOPHEN 325 MG PO TABS: 650.0000 mg | ORAL_TABLET | Freq: Four times a day (QID) | ORAL | Status: DC | PRN

## 2024-06-18 MED ORDER — ONDANSETRON HCL 4 MG/2ML IJ SOLN: 4.0000 mg | Freq: Four times a day (QID) | INTRAMUSCULAR | Status: DC | PRN

## 2024-06-18 MED ORDER — FOLIC ACID 1 MG PO TABS: 1.0000 mg | ORAL_TABLET | Freq: Every day | ORAL | Status: DC

## 2024-06-18 MED ORDER — ACETAMINOPHEN 650 MG RE SUPP: 650.0000 mg | Freq: Four times a day (QID) | RECTAL | Status: DC | PRN

## 2024-06-18 MED ORDER — ADULT MULTIVITAMIN W/MINERALS CH
1.0000 | ORAL_TABLET | Freq: Every day | ORAL | Status: DC
  Administered 2024-06-18 – 2024-06-19 (×2): 1 via ORAL
  Filled 2024-06-18 (×2): qty 1

## 2024-06-18 MED ORDER — ONDANSETRON HCL 4 MG PO TABS: 4.0000 mg | ORAL_TABLET | Freq: Four times a day (QID) | ORAL | Status: DC | PRN

## 2024-06-18 MED ORDER — IBUPROFEN 200 MG PO TABS: 400.0000 mg | ORAL_TABLET | ORAL | Status: DC | PRN

## 2024-06-18 MED ORDER — BUPRENORPHINE HCL-NALOXONE HCL 8-2 MG SL SUBL
1.0000 | SUBLINGUAL_TABLET | Freq: Every day | SUBLINGUAL | Status: DC
  Administered 2024-06-18: 1 via SUBLINGUAL
  Filled 2024-06-18: qty 1

## 2024-06-18 MED ORDER — HEPARIN SODIUM (PORCINE) 5000 UNIT/ML IJ SOLN
5000.0000 [IU] | Freq: Three times a day (TID) | INTRAMUSCULAR | Status: DC
  Administered 2024-06-18: 5000 [IU] via SUBCUTANEOUS
  Filled 2024-06-18 (×2): qty 1

## 2024-06-18 MED ORDER — NICOTINE 21 MG/24HR TD PT24
21.0000 mg | MEDICATED_PATCH | Freq: Every day | TRANSDERMAL | Status: DC
  Administered 2024-06-18 – 2024-06-19 (×2): 21 mg via TRANSDERMAL
  Filled 2024-06-18 (×2): qty 1

## 2024-06-18 MED ORDER — BUPRENORPHINE HCL-NALOXONE HCL 2-0.5 MG SL SUBL
2.0000 | SUBLINGUAL_TABLET | Freq: Two times a day (BID) | SUBLINGUAL | Status: DC
  Administered 2024-06-19: 2 via SUBLINGUAL
  Filled 2024-06-18: qty 2

## 2024-06-18 MED ORDER — ALBUTEROL SULFATE (2.5 MG/3ML) 0.083% IN NEBU: 2.5000 mg | INHALATION_SOLUTION | RESPIRATORY_TRACT | Status: DC | PRN

## 2024-06-18 NOTE — H&P (Addendum)
 " History and Physical    Charles Mahoney FMW:[id number] DOB: 11-27-1990 DOA: 06/17/2024  PCP: No primary care provider on file.  Patient coming from: home  I have personally briefly reviewed patient's old medical records in [facility]  Chief Complaint: IVC - substance abuse , passive SI  HPI: Charles Mahoney is a 34 y.o. male with medical history significant of  substance abuse d/o ( benzodiazepine, ETOH)  anxeity ADHD who presents to ED brought in by [facility] under IVC due to concern from family that patient would overdose. Patient has began taking clobromazolam that he procures from online store. In addition he also has increased eoth intake, per notes -daily etoh intake. Patient in ED was noted to be intoxicated with spurts of agitation and was sedated with geodon . Medicine requested to admit patient for observation due to ? Ingestion/ overdose. Patient currently is easy to wake. He currently denies taking in a substance last evening. He is able to state why he is in the hospital.  He notes no current complaints, n/v/d/chest pain or sob.    ED Course:  Afeb,  bp 107/62, hr 60, rr 12 sat 91%  EKG: nsr ,RVH, early repolarization  Wbc 6.6, hgb 13.5, plt 258 ETOH 126 Na 142, K 4.4, cl 102,  Tylenol  <10 Salicilayte <7 Mag 2.3 CTH: NAD Utox: +benzo, + thc  UA: negative  Tx benadryl  Geodon , mvi , thiamine , folic acid   Review of Systems: As per HPI otherwise 10 point review of systems negative.   Past Medical History:  Diagnosis Date   ADHD (attention deficit hyperactivity disorder)    Anxiety     History reviewed. No pertinent surgical history.   reports that he has never smoked. He does not have any smokeless tobacco history on file. He reports current alcohol use. He reports that he does not use drugs.  Allergies[1]  Family History  Problem Relation Age of Onset   Depression Father    Mental retardation Father        anxiety (Klonopin )   Depression Brother    Mental  retardation Brother        anxiety    Prior to Admission medications  Medication Sig Start Date End Date Taking? Authorizing Provider  amphetamine -dextroamphetamine  (ADDERALL) 20 MG tablet Take 1 tablet (20 mg total) by mouth 3 (three) times daily. 09/13/15   Weber, Lauraine CROME, PA-C  clonazePAM  (KLONOPIN ) 1 MG tablet TAKE (1) TABLET TWICE DAILY AS NEEDED FOR ANXIETY. 09/05/15   Smith, Kristi M, MD  FLUoxetine  (PROZAC ) 20 MG tablet Take 2 tablets (40 mg total) by mouth daily. 08/05/15   Smith, Kristi M, MD  HYDROcodone -acetaminophen  (NORCO) 5-325 MG per tablet Take 1 tablet by mouth every 8 (eight) hours as needed for pain. Patient not taking: Reported on 06/29/2015 12/08/12   [name] HERO, [place]    Physical Exam: Vitals:   06/17/24 1930 06/17/24 2231 06/17/24 2300 06/17/24 2306  BP: 120/87 (!) 99/49 (!) 106/59   Pulse: 85 (!) 54 (!) 54   Resp: 15 15 18    Temp:    98.7 F (37.1 C)  TempSrc:    Oral  SpO2: 92%  94%     Constitutional: NAD, calm, comfortable Vitals:   06/17/24 1930 06/17/24 2231 06/17/24 2300 06/17/24 2306  BP: 120/87 (!) 99/49 (!) 106/59   Pulse: 85 (!) 54 (!) 54   Resp: 15 15 18    Temp:    98.7 F (37.1 C)  TempSrc:  Oral  SpO2: 92%  94%    Eyes: PERRL, lids and conjunctivae normal ENMT: Mucous membranes are dry.  Respiratory: clear to auscultation bilaterally, no wheezing, no crackles. Normal respiratory effort. No accessory muscle use.  Cardiovascular: Regular rate and rhythm, no murmurs / rubs / gallops. No extremity edema. 2+ pedal pulses.  Abdomen: no tenderness, no masses palpated. No hepatosplenomegaly. Bowel sounds positive.  Musculoskeletal: no clubbing / cyanosis. No joint deformity upper and lower extremities. Good ROM, no contractures. Normal muscle tone.  Skin: no rashes, lesions, ulcers. No induration Neurologic: CN 2-12 grossly intact. Sensation intact,  Strength 5/5 in all 4.  Psychiatric: poor insight. Alert and oriented x 3. Normal mood.     Labs on Admission: I have personally reviewed following labs and imaging studies  CBC: Recent Labs  Lab 06/17/24 1735  WBC 6.6  NEUTROABS 4.7  HGB 13.5  HCT 39.8  MCV 92.3  PLT 258   Basic Metabolic Panel: Recent Labs  Lab 06/17/24 1735  NA 142  K 4.4  CL 102  CO2 27  GLUCOSE 98  BUN 10  CREATININE 0.92  CALCIUM 9.7  MG 2.3   GFR: CrCl cannot be calculated (Unknown ideal weight.). Liver Function Tests: Recent Labs  Lab 06/17/24 1735  AST 31  ALT 23  ALKPHOS 31*  BILITOT 0.4  PROT 7.9  ALBUMIN 4.4   No results for input(s): LIPASE, AMYLASE in the last 168 hours. No results for input(s): AMMONIA in the last 168 hours. Coagulation Profile: No results for input(s): INR, PROTIME in the last 168 hours. Cardiac Enzymes: No results for input(s): CKTOTAL, CKMB, CKMBINDEX, TROPONINI in the last 168 hours. BNP (last 3 results) No results for input(s): PROBNP in the last 8760 hours. HbA1C: No results for input(s): HGBA1C in the last 72 hours. CBG: No results for input(s): GLUCAP in the last 168 hours. Lipid Profile: No results for input(s): CHOL, HDL, LDLCALC, TRIG, CHOLHDL, LDLDIRECT in the last 72 hours. Thyroid  Function Tests: No results for input(s): TSH, T4TOTAL, FREET4, T3FREE, THYROIDAB in the last 72 hours. Anemia Panel: No results for input(s): VITAMINB12, FOLATE, FERRITIN, TIBC, IRON, RETICCTPCT in the last 72 hours. Urine analysis:    Component Value Date/Time   COLORURINE STRAW (A) 06/17/2024 2356   APPEARANCEUR CLEAR 06/17/2024 2356   LABSPEC 1.005 06/17/2024 2356   PHURINE 6.0 06/17/2024 2356   GLUCOSEU NEGATIVE 06/17/2024 2356   HGBUR NEGATIVE 06/17/2024 2356   BILIRUBINUR NEGATIVE 06/17/2024 2356   KETONESUR NEGATIVE 06/17/2024 2356   PROTEINUR NEGATIVE 06/17/2024 2356   NITRITE NEGATIVE 06/17/2024 2356   LEUKOCYTESUR NEGATIVE 06/17/2024 2356    Radiological Exams on  Admission: CT Head Wo Contrast Result Date: 06/17/2024 EXAM: CT HEAD WITHOUT CONTRAST 06/17/2024 08:11:53 PM TECHNIQUE: CT of the head was performed without the administration of intravenous contrast. Automated exposure control, iterative reconstruction, and/or weight based adjustment of the mA/kV was utilized to reduce the radiation dose to as low as reasonably achievable. COMPARISON: None available. CLINICAL HISTORY: Mental status change, unknown cause. FINDINGS: BRAIN AND VENTRICLES: No acute hemorrhage. No evidence of acute infarct. No hydrocephalus. No extra-axial collection. No mass effect or midline shift. ORBITS: No acute abnormality. SINUSES: No acute abnormality. SOFT TISSUES AND SKULL: No acute soft tissue abnormality. No skull fracture. IMPRESSION: 1. No acute intracranial abnormality. Electronically signed by: Pinkie Pebbles MD 06/17/2024 08:16 PM EST RP Workstation: [id number]    EKG: Independently reviewed.  Assessment/Plan  Substance intoxication with severe agitation  Passive SI -due  to concern for benzo overdose - patient admitted to medicine for observation  - utox + benzo , marijuanna - s/p benadryl  and geodon  in ED -admit to progressive care monitor on tele and continuous pulse ox  - psych consult for further assistance  -patient on IVC -continue with ivfs  Hx of ETOH abuse  -placed on CIWA - patient w/o signs of etoh w/d   ADHD  - on adderall  as out patient    DVT prophylaxis: hjeparin Code Status: full/ as discussed per patient wishes in event of cardiac arrest  Family Communication: none at bedside Disposition Plan: patient  expected to be admitted greater than 2 midnights  Consults called: psych  Admission status: progressive care    Camila DELENA Ned MD [facility]   If 7PM-7AM, please contact night-coverage [email or web address] Password [email or web address]  06/18/2024, 1:48 AM        [1] No Known Allergies  "

## 2024-06-18 NOTE — ED Notes (Signed)
 IV team in to start IV.JRPRN

## 2024-06-18 NOTE — ED Notes (Signed)
 Pt said he is not going to stay in the hospital any longer and is going to leave. I told the pt he can not leave at this time because his treatment at the hospital is not complete.

## 2024-06-18 NOTE — Progress Notes (Signed)
 The hospital's IV Nurse received a consult for IV access; pt not currently in soft wrist restraints; sleepy but follows commands; pt noted to have multiple veins without use of the ultrasound-- but scar tissue is noted; IV placed on 2nd attempt with help of a NT holding his left arm; site wrapped loosely with gauze kling; suggest restraining at least the right wrist so pt doesn't pull this IV out.  RN aware.

## 2024-06-18 NOTE — ED Notes (Addendum)
 Vitals were put in on this at 740. Pulse is not showing up on monitor but is charted in Narrator.

## 2024-06-18 NOTE — Progress Notes (Signed)
 " [facility] Progress Note  Patient: Charles Mahoney     FMW:[id number]  DOA: 06/17/2024   PCP: No primary care provider on file.       Brief hospital course: 34 y/o F with ADHD, anxiety and substance abuse who presented to the ED somnolent, stating he takes Suboxone  and may be withdrawing. Further history revealed she was taking Clobromazolam that he had been ordering online along with Gabapentin and Suboxone . He also admitted to using a weed pen, mushrooms & drinking alcohol every day.  Further history obtained from his mother. Per his mother, he has lost his job and his children are in foster care and he has been emotionally unstable. He was found to be intoxicated on Thursday and his family decided to watch over him. He continued to take his home meds and progressively becoming more unresponsive at home which was concerning. They were unable to communicate with him and were advised that an IVC would need to be done to have him admitted for further treatment.   In ED : UDS + for benzo and THC ETOH 126 CT head Neg.   Subjective:  Drowsy. Has no complaints but is asking for his suboxone .   Assessment and Plan: Principal Problem:   Substance intoxication (HCC)  - awakens but falls asleep easily - no symptoms of drug withdrawal - cont IVC and waist belt - psych consult requested - resume Suboxone  to prevent withdrawal  H/o ETOH abuse - CIWA scale  ADHD and severe anxiety - hold Adderall, Klonopin , Gabapentin and Zoloft  Mildly hypotensive - follow- will add IVF since he is still quite somnolent  Glucose intolerance - A1c 5.7    Code Status: Full Code Total time on patient care: 35 min DVT prophylaxis:  heparin  injection 5,000 Units Start: 06/18/24 0600  Objective:   Vitals:   06/17/24 2231 06/17/24 2300 06/17/24 2306 06/18/24 0256  BP: (!) 99/49 (!) 106/59    Pulse: (!) 54 (!) 54    Resp: 15 18    Temp:   98.7 F (37.1 C) (!) 97.4 F (36.3 C)  TempSrc:    Oral Oral  SpO2:  94%     There were no vitals filed for this visit. Exam: General exam: Appears comfortable, somnolent HEENT: oral mucosa moist Respiratory system: Clear to auscultation.  Cardiovascular system: S1 & S2 heard  Gastrointestinal system: Abdomen soft, non-tender, nondistended. Normal bowel sounds   Extremities: No cyanosis, clubbing or edema Psychiatry:  flat affect   CBC: Recent Labs  Lab 06/17/24 1735  WBC 6.6  NEUTROABS 4.7  HGB 13.5  HCT 39.8  MCV 92.3  PLT 258   Basic Metabolic Panel: Recent Labs  Lab 06/17/24 1735  NA 142  K 4.4  CL 102  CO2 27  GLUCOSE 98  BUN 10  CREATININE 0.92  CALCIUM 9.7  MG 2.3     Scheduled Meds:  folic acid   1 mg Oral Daily   heparin   5,000 Units Subcutaneous Q8H   multivitamin with minerals  1 tablet Oral Daily   nicotine   21 mg Transdermal Once   thiamine   100 mg Oral Daily   Or   thiamine   100 mg Intravenous Daily    Imaging and lab data personally reviewed   Author: Yuniel Blaney  06/18/2024 7:47 AM  To contact [facility]>   Check the care team in [facility] and look for the attending/consulting [facility] provider listed  Log into [email or web address] and use [place]'s universal  password   Go to> [facility]  and find provider  If you still have difficulty reaching the provider, please page the [facility] (Director on Call) for the Hospitalists listed on amion     "

## 2024-06-19 ENCOUNTER — Other Ambulatory Visit (HOSPITAL_COMMUNITY): Payer: Self-pay

## 2024-06-19 DIAGNOSIS — R001 Bradycardia, unspecified: Secondary | ICD-10-CM | POA: Insufficient documentation

## 2024-06-19 DIAGNOSIS — F430 Acute stress reaction: Secondary | ICD-10-CM

## 2024-06-19 DIAGNOSIS — F19920 Other psychoactive substance use, unspecified with intoxication, uncomplicated: Secondary | ICD-10-CM

## 2024-06-19 DIAGNOSIS — R45851 Suicidal ideations: Secondary | ICD-10-CM

## 2024-06-19 DIAGNOSIS — F19921 Other psychoactive substance use, unspecified with intoxication with delirium: Secondary | ICD-10-CM | POA: Diagnosis not present

## 2024-06-19 DIAGNOSIS — F419 Anxiety disorder, unspecified: Secondary | ICD-10-CM

## 2024-06-19 DIAGNOSIS — E7439 Other disorders of intestinal carbohydrate absorption: Secondary | ICD-10-CM | POA: Insufficient documentation

## 2024-06-19 LAB — CBC
HCT: 38 % — ABNORMAL LOW (ref 39.0–52.0)
Hemoglobin: 13.1 g/dL (ref 13.0–17.0)
MCH: 31.5 pg (ref 26.0–34.0)
MCHC: 34.5 g/dL (ref 30.0–36.0)
MCV: 91.3 fL (ref 80.0–100.0)
Platelets: 255 K/uL (ref 150–400)
RBC: 4.16 MIL/uL — ABNORMAL LOW (ref 4.22–5.81)
RDW: 13.4 % (ref 11.5–15.5)
WBC: 5.4 K/uL (ref 4.0–10.5)
nRBC: 0 % (ref 0.0–0.2)

## 2024-06-19 LAB — BLOOD GAS, VENOUS
Acid-Base Excess: 11.1 mmol/L — ABNORMAL HIGH (ref 0.0–2.0)
Bicarbonate: 37.4 mmol/L — ABNORMAL HIGH (ref 20.0–28.0)
O2 Saturation: 54.3 %
Patient temperature: 36.5
pCO2, Ven: 54 mmHg (ref 44–60)
pH, Ven: 7.45 — ABNORMAL HIGH (ref 7.25–7.43)
pO2, Ven: 32 mmHg (ref 32–45)

## 2024-06-19 LAB — BASIC METABOLIC PANEL WITH GFR
Anion gap: 9 (ref 5–15)
BUN: 16 mg/dL (ref 6–20)
CO2: 28 mmol/L (ref 22–32)
Calcium: 9.4 mg/dL (ref 8.9–10.3)
Chloride: 101 mmol/L (ref 98–111)
Creatinine, Ser: 1 mg/dL (ref 0.61–1.24)
GFR, Estimated: >60 mL/min
Glucose, Bld: 101 mg/dL — ABNORMAL HIGH (ref 70–99)
Potassium: 4.2 mmol/L (ref 3.5–5.1)
Sodium: 139 mmol/L (ref 135–145)

## 2024-06-19 MED ORDER — NICOTINE 21 MG/24HR TD PT24
21.0000 mg | MEDICATED_PATCH | Freq: Every day | TRANSDERMAL | 0 refills | Status: AC
  Filled 2024-06-19: qty 14, 14d supply, fill #0

## 2024-06-19 MED ORDER — NICOTINE 14 MG/24HR TD PT24
14.0000 mg | MEDICATED_PATCH | Freq: Every day | TRANSDERMAL | 0 refills | Status: DC
  Filled 2024-06-19: qty 14, 14d supply, fill #0

## 2024-06-19 MED ORDER — FOLIC ACID 1 MG PO TABS
1.0000 mg | ORAL_TABLET | Freq: Every day | ORAL | 0 refills | Status: AC
  Filled 2024-06-19: qty 30, 30d supply, fill #0

## 2024-06-19 MED ORDER — NICOTINE 7 MG/24HR TD PT24
7.0000 mg | MEDICATED_PATCH | TRANSDERMAL | 0 refills | Status: DC
  Filled 2024-06-19: qty 28, 28d supply, fill #0

## 2024-06-19 MED ORDER — VITAMIN B-1 100 MG PO TABS
100.0000 mg | ORAL_TABLET | Freq: Every day | ORAL | 0 refills | Status: AC
  Filled 2024-06-19: qty 30, 30d supply, fill #0

## 2024-06-19 MED ORDER — ADULT MULTIVITAMIN W/MINERALS CH: 1.0000 | ORAL_TABLET | Freq: Every day | ORAL | Status: AC

## 2024-06-19 NOTE — Plan of Care (Signed)

## 2024-06-19 NOTE — TOC Initial Note (Addendum)
 Transition of Care [facility]) - Initial/Assessment Note    Patient Details  Name: Charles Mahoney MRN: [id number] Date of Birth: 03-06-1991  Transition [facility] Care [facility]) CM/SW Contact:    Heather DELENA Saltness, LCSW Phone Number: 06/19/2024, 10:19 AM  Clinical Narrative:                 Pt admitted from home under IVC due to family's concern for active substance abuse, alcohol abuse, and drug overdose. Pt reports using clobromazolam, marijuana, and alcohol daily. Pt also reports using Gabapentin and Suboxone , which he purchases online. Psych consulted, awaiting recommendations. TOC will continue to follow.    Expected Discharge Plan: Home/Self Care Barriers to Discharge: Continued Medical Work up   Patient Goals and CMS Choice Patient states their goals for this hospitalization and ongoing recovery are:: To return home   Choice offered to / list presented to : NA [place] ownership interest in [facility].provided to:: Parent NA    Expected Discharge Plan and Services In-house Referral: Clinical Social Work Discharge Planning Services: NA Post Acute Care Choice: NA Living arrangements for the past 2 months: Single Family Home                 DME Arranged: N/A DME Agency: NA       HH Arranged: NA HH Agency: NA        Prior Living Arrangements/Services Living arrangements for the past 2 months: Single Family Home Lives with:: Self, Parents Patient language and need for interpreter reviewed:: Yes Do you feel safe going back to the place where you live?: Yes      Need for Family Participation in Patient Care: No (Comment) Care giver support system in place?: Yes (comment)   Criminal Activity/Legal Involvement Pertinent to Current Situation/Hospitalization: No - Comment as needed  Activities of Daily Living   ADL Screening (condition at time of admission) Independently performs ADLs?: Yes (appropriate for developmental age) Is the patient deaf or have difficulty  hearing?: No Does the patient have difficulty seeing, even when wearing glasses/contacts?: No Does the patient have difficulty concentrating, remembering, or making decisions?: No  Permission Sought/Granted Permission sought to share information with : Family Supports Permission granted to share information with : Yes, Verbal Permission Granted   Emotional Assessment Appearance::  (UTA) Attitude/Demeanor/Rapport: Unable to Assess Affect (typically observed): Unable to Assess Orientation: : Oriented to Self, Oriented to Place, Oriented to  Time, Oriented to Situation Alcohol / Substance Use: Alcohol Use, Illicit Drugs Psych Involvement: Yes (comment)  Admission diagnosis:  Substance intoxication (HCC) [F19.929] Substance use disorder [F19.90] Patient Active Problem List   Diagnosis Date Noted   Substance intoxication (HCC) 06/18/2024   ADHD (attention deficit hyperactivity disorder) 12/19/2012   PCP:  Freddrick No Pharmacy:   [facility] [place], [place] - [address] [facility] [address] [place] [address] Phone: [phone] Fax: [phone]     Social Drivers of Health (SDOH) Social History: SDOH Screenings   Food Insecurity: No Food Insecurity (06/18/2024)  Housing: Low Risk (06/18/2024)  Transportation Needs: No Transportation Needs (06/18/2024)  Utilities: Not At Risk (06/18/2024)  Tobacco Use: Unknown (06/17/2024)   SDOH Interventions: None      Readmission Risk Interventions    06/19/2024   10:07 AM  Readmission Risk Prevention Plan  Post Dischage Appt Not Complete  Appt Comments Pt has no PCP listed in chart, but does have active [facility] Medicaid insurance coverage.  Medication  Screening Complete  Transportation Screening Complete    Signed: Heather Saltness, MSW, LCSW Clinical Social Worker Inpatient Care Management 06/19/2024 10:19 AM

## 2024-06-19 NOTE — Progress Notes (Signed)
 Discharge meds in a secure bag delivered to patient's nurse by this RN

## 2024-06-19 NOTE — Plan of Care (Signed)
  Problem: Pain Managment: Goal: General experience of comfort will improve and/or be controlled Outcome: Progressing   Problem: Safety: Goal: Ability to remain free from injury will improve Outcome: Progressing   Problem: Skin Integrity: Goal: Risk for impaired skin integrity will decrease Outcome: Progressing

## 2024-06-19 NOTE — Discharge Instructions (Addendum)
 Please have your HbA1c rechecked in 3-4 months.   You were cared for by a hospitalist during your hospital stay. Please review all of you discharge paperwork on the day of discharge and be sure you have all of your prescribed medications and please read the below instructions:  Once you are discharged, your primary care physician will handle any further medical issues. Please note that NO REFILLS for any discharge medications will be authorized once you are discharged as it is imperative that you return to your primary care physician (or establish a relationship with a primary care physician if you do not have one) for your aftercare needs. Please obtain a follow up appointment with your primary care physician within 1-2 weeks of discharge. Please take all your medications with you for your next visit with your Primary MD. Please request your Primary MD to go over all Hospital Tests and Procedures, Radiological results at the follow up appointment. In some cases, there will be blood work, cultures and biopsy results pending at the time of your discharge. Please request that your primary care M.D. goes through all the records of your hospital data and follows up on these results. Please get all hospital records sent to your primary MD by signing hospital release before you go home or request your primary care doctor's office to assist with obtaining medical records.   You must read complete instructions/literature along with all the possible adverse reactions/side effects for all the medicines that have been prescribed to you. Take any new medicines after you have completely understood and accpet all the possible adverse reactions/side effects.  Please take medications as prescribed and speak with your doctor if changes are needed.   If you have smoked or chewed tobacco in the last 2 yrs please stop. Stop any regular alcohol  and or any recreational drug use. Wear Seat belts while driving.   If you  had Pneumonia at the Hospital: Please get a 2 view Chest X ray done in 6-8 weeks after hospital discharge or sooner if instructed by your Primary MD.   If you have Congestive Heart Failure: Follow a cardiac low salt diet and 1.5 lit/day fluid restriction. Please call your Cardiologist or Primary MD anytime you have any of the following symptoms:  1) 3 pound weight gain in 24 hours or 5 pounds in 1 week  2) shortness of breath, with or without a dry hacking cough  3) increasing swelling in the feet or stomach  4) if you have to sleep on extra pillows at night in order to breathe   If you have Diabetes: Check blood sugars 4 times/day- once on AM empty stomach and then before each meal. Log in all results and show them to your primary doctor at your next visit. If glucose readings are often under 60 or above 400 call your primary MD to see if medication dosages need to be adjusted   If you have Syncope (passing out) or Seizure/Convulsions/Epilepsy: Please do not drive, operate heavy machinery, participate in activities at heights or participate in high speed sports until you have seen by Primary MD or a Neurologist and advised to do so again. Per [place] statutes, patients with seizures are not allowed to drive until they have been seizure-free for six months.  Use caution when using heavy equipment or power tools. Avoid working on ladders or at heights. Take showers instead of baths. Ensure the water temperature is not too high on the home water  heater. Do not go swimming alone. Do not lock yourself in a room alone (i.e. bathroom). When caring for infants or small children, sit down when holding, feeding, or changing them to minimize risk of injury to the child in the event you have a seizure. Maintain good sleep hygiene. Avoid alcohol.    If you had Gastrointestinal Bleeding: Please ask your Primary MD to check a complete blood count within one week of discharge or at your next visit.  Your endoscopic/colonoscopic biopsies that are pending at the time of discharge will also need to followed by your Primary MD.    Rosine can reach the hospitalist office at phone [phone] or fax [phone]   If you do not have a primary care physician, you can call [phone] for a physician referral.

## 2024-06-19 NOTE — Progress Notes (Signed)
 " [facility] Progress Note  Patient: Charles Mahoney     FMW:[id number]  DOA: 06/17/2024   PCP: Pcp, No       Brief hospital course: 34 y/o F with ADHD, anxiety and substance abuse who presented to the ED somnolent, stating he takes Suboxone  and may be withdrawing. Further history revealed she was taking Clobromazolam that he had been ordering online along with Gabapentin and Suboxone . He also admitted to using a weed pen, mushrooms & drinking alcohol every day.  Further history obtained from his mother. Per his mother, he has lost his job and his children are in foster care and he has been emotionally unstable. He was found to be intoxicated on Thursday and his family decided to watch over him. He continued to take his home meds and progressively becoming more unresponsive at home which was concerning. They were unable to communicate with him and were advised that an IVC would need to be done to have him admitted for further treatment.   In ED : UDS + for benzo and THC ETOH 126 CT head Neg.   Subjective:  He feels anxious. Interested in going home.   Assessment and Plan: Principal Problem:   Substance intoxication (HCC)  - somnolent on 1/17, 1/18 and now alert - admits that he was taking Clobromazolam for his anxiety but is not sure how much he was taking- He does not admit to daily ETOH use and is not feeling withdrawal symptoms - no symptoms of drug withdrawal - cont IVC  - psych consult requested - resumed Suboxone    H/o ETOH abuse - CIWA scale- given a dose of Ativan  today  ADHD and severe anxiety - hold Adderall, Klonopin , Gabapentin and Zoloft- await psych team to make further decisions on these meds  Mildly hypotensive - follow-  resolved- likely due to his home meds, ED meds and poor oral intake  Bradycardia - chronic and at baseline  Glucose intolerance - A1c 5.7- discussed prediabetes, dietary changes and prevention of diabetes    Code Status: Full  Code Total time on patient care: 45 min DVT prophylaxis:  heparin  injection 5,000 Units Start: 06/18/24 0600  Objective:   Vitals:   06/19/24 0247 06/19/24 0618 06/19/24 1043 06/19/24 1247  BP: 130/70 109/73 130/77 129/75  Pulse: 67 (!) 49 68 81  Resp: 20 20 20 20   Temp: 97.8 F (36.6 C) 98.2 F (36.8 C) 98.2 F (36.8 C) 98.3 F (36.8 C)  TempSrc: Oral Oral    SpO2: 95% 98% 98% 94%  Weight:      Height:       Filed Weights   06/18/24 2319  Weight: 68 kg   Exam: General exam: Appears comfortable, somnolent HEENT: oral mucosa moist Respiratory system: Clear to auscultation.  Cardiovascular system: S1 & S2 heard  Gastrointestinal system: Abdomen soft, non-tender, nondistended. Normal bowel sounds   Extremities: No cyanosis, clubbing or edema Psychiatry:  flat affect   CBC: Recent Labs  Lab 06/17/24 1735 06/18/24 1048 06/19/24 0507  WBC 6.6 8.7 5.4  NEUTROABS 4.7  --   --   HGB 13.5 14.2 13.1  HCT 39.8 41.5 38.0*  MCV 92.3 91.8 91.3  PLT 258 266 255   Basic Metabolic Panel: Recent Labs  Lab 06/17/24 1735 06/18/24 1050 06/19/24 0507  NA 142 141 139  K 4.4 4.1 4.2  CL 102 100 101  CO2 27 29 28   GLUCOSE 98 116* 101*  BUN 10 16 16   CREATININE  0.92 0.95 1.00  CALCIUM 9.7 10.0 9.4  MG 2.3  --   --      Scheduled Meds:  buprenorphine -naloxone   2 tablet Sublingual BID   folic acid   1 mg Oral Daily   heparin   5,000 Units Subcutaneous Q8H   multivitamin with minerals  1 tablet Oral Daily   nicotine   21 mg Transdermal Daily   thiamine   100 mg Oral Daily   Or   thiamine   100 mg Intravenous Daily    Imaging and lab data personally reviewed   Author: Copelan Maultsby  06/19/2024 12:53 PM  To contact [facility]>   Check the care team in [facility] and look for the attending/consulting TRH provider listed  Log into [email or web address] and use [place]'s universal password   Go to> [facility]  and find provider  If you still have difficulty reaching  the provider, please page the [facility] (Director on Call) for the Hospitalists listed on amion     "

## 2024-06-19 NOTE — TOC Progression Note (Addendum)
 Transition of Care [facility]) - Progression Note    Patient Details  Name: Charles Mahoney MRN: [id number] Date of Birth: 07-08-1990  Transition of Care [facility]) CM/SW Contact  Heather DELENA Saltness, LCSW Phone Number: 06/19/2024, 5:45 PM  Clinical Narrative:    CSW notified by Psych NP, Majel Ramp, of pt being psychiatrically cleared for discharge. CSW attempted to upload change-of-commitment paperwork to [facility] system for magistrate, but no File number listed on any of the copies of IVC paperwork in pt's chart and no mention of File number in Epic. CSW attempted to search [place] [address] system E-File website by pt's name, but nothing populated. CSW then attempted to call [facility] of Court at [phone], to obtain File number. No answer, voicemail advised [place] of Court is closed today due to [facility] and will re-open for normal business hours tomorrow 1/20. CSW spoke with Garnette Shed, at [facility] office at [phone], via phone call. Magistrate Shed states that she does not have E-File number, and advised that [place] of Court will provide the E-File number once they're open tomorrow 1/20. MD and Psych NP notified of inability to rescind IVC without E-File number and inability to obtain File number from [place] of Court due to holiday.  Per Psych NP, Majel Ramp, this is a procedural clerk of court problem, that we will have to fix tomorrow once the courts open. But yes, it is still legal and valid to rescind his involuntary commitment (patient rights, no longer meets criteria). RN then inquired if pt can discharge home today. Psych NP, Majel, advised Yes, once he has discharge orders in. Attending MD then placed discharge orders. TOC Supervisor, Delon Lesches, and [employer] Manager, [employer], notified.   Expected Discharge Plan: Home/Self Care Barriers to Discharge: Continued Medical Work up   Expected Discharge Plan and Services In-house  Referral: Clinical Social Work Discharge Planning Services: NA Post Acute Care Choice: NA Living arrangements for the past 2 months: Single Family Home Expected Discharge Date: 06/19/24               DME Arranged: N/A DME Agency: NA       HH Arranged: NA HH Agency: NA         Social Drivers of Health (SDOH) Interventions SDOH Screenings   Food Insecurity: No Food Insecurity (06/18/2024)  Housing: Low Risk (06/18/2024)  Transportation Needs: No Transportation Needs (06/18/2024)  Utilities: Not At Risk (06/18/2024)  Tobacco Use: Unknown (06/17/2024)    Readmission Risk Interventions    06/19/2024   10:07 AM  Readmission Risk Prevention Plan  Post Dischage Appt Not Complete  Appt Comments Pt has no PCP listed in chart, but does have active [facility] Medicaid insurance coverage.  Medication Screening Complete  Transportation Screening Complete    Signed: Heather Saltness, MSW, LCSW Clinical Social Worker Inpatient Care Management 06/19/2024 5:45 PM

## 2024-06-19 NOTE — Discharge Summary (Signed)
 Physician Discharge Summary  Charles Mahoney FMW:[id number] DOB: Mar 17, 1991 DOA: 06/17/2024  PCP: Pcp, No  Admit date: 06/17/2024 Discharge date: 06/19/2024 Discharging to: home Recommendations for Outpatient Follow-up:  F/u A1c  Consults:  Psychiatry      Discharge Diagnoses:   Principal Problem:   Substance intoxication (HCC) Active Problems:   ADHD   Glucose intolerance   Sinus bradycardia    Brief hospital course: 34 y/o F with ADHD, anxiety and substance abuse who presented to the ED somnolent, stating he takes Suboxone  and may be withdrawing. Further history revealed she was taking Clobromazolam that he had been ordering online along with Gabapentin and Suboxone . He also admitted to using a weed pen, mushrooms & drinking alcohol every day.  Further history obtained from his mother. Per his mother, he has lost his job and his children are in foster care and he has been emotionally unstable. He was found to be intoxicated on Thursday and his family decided to watch over him. He continued to take his home meds and progressively becoming more unresponsive at home which was concerning. They were unable to communicate with him and were advised that an IVC would need to be done to have him admitted for further treatment.    In ED : UDS + for benzo and THC ETOH 126 CT head Neg.     Subjective:  He feels anxious. Interested in going home.    Assessment and Plan: Principal Problem:   Substance intoxication (HCC)  - somnolent on 1/17, 1/18 and now alert - admits that he was taking Clobromazolam for his anxiety but is not sure how much he was taking- He does not admit to daily ETOH use and is not feeling withdrawal symptoms - no symptoms of drug withdrawal - psych consult requested- recommended to continue current regimen and avoid unprescribed meds     H/o ETOH abuse - no signs of withdrawal- states he doesn't drink daily   ADHD and severe anxiety - cont Adderall, Klonopin ,  Gabapentin and Zoloft-    Mildly hypotensive  -  resolved- likely due to his home meds, Emergency room meds and poor oral intake   Bradycardia - chronic and at baseline   Glucose intolerance - A1c 5.7- discussed prediabetes, dietary changes and prevention of diabetes    Discharge Instructions   Allergies as of 06/19/2024   No Known Allergies      Medication List     TAKE these medications    Buprenorphine  HCl-Naloxone  HCl 8-2 MG Film Place 1 Film under the tongue 2 (two) times daily.   clonazePAM  0.5 MG tablet Commonly known as: KLONOPIN  Take 0.5 mg by mouth daily as needed for anxiety.   folic acid  1 MG tablet Commonly known as: FOLVITE  Take 1 tablet (1 mg total) by mouth daily. Start taking on: June 20, 2024   gabapentin 600 MG tablet Commonly known as: NEURONTIN Take 600 mg by mouth 3 (three) times daily.   multivitamin with minerals Tabs tablet Take 1 tablet by mouth daily. Start taking on: June 20, 2024   nicotine  14 mg/24hr patch Commonly known as: Nicoderm CQ  Place 1 patch (14 mg total) onto the skin daily. Start when 21 mg patch finished   nicotine  7 mg/24hr patch Commonly known as: NICODERM CQ  - dosed in mg/24 hr Place 1 patch (7 mg total) onto the skin daily. Start when 14 mg patch finished   nicotine  21 mg/24hr patch Commonly known as: NICODERM CQ  - dosed in mg/24  hours Place 1 patch (21 mg total) onto the skin daily. Start taking on: June 20, 2024   NON FORMULARY Clobromazolam or Phenazolam 0.5 mg   sertraline 100 MG tablet Commonly known as: ZOLOFT Take 100 mg by mouth daily.   thiamine  100 MG tablet Commonly known as: Vitamin B-1 Take 1 tablet (100 mg total) by mouth daily. Start taking on: June 20, 2024            The results of significant diagnostics from this hospitalization (including imaging, microbiology, ancillary and laboratory) are listed below for reference.    ECHOCARDIOGRAM COMPLETE Result Date:  06/18/2024    ECHOCARDIOGRAM REPORT   Patient Name:   Charles CALABRETTA Date of Exam: 06/18/2024 Medical Rec #:  [id number]      Height:       67.0 in Accession #:    [id number]     Weight:       134.0 lb Date of Birth:  June 01, 1991      BSA:          1.706 m Patient Age:    33 years       BP:           112/61 mmHg Patient Gender: M              HR:           51 bpm. Exam Location:  Inpatient Procedure: 2D Echo, Cardiac Doppler and Color Doppler (Both Spectral and Color            Flow Doppler were utilized during procedure). Indications:     ABN EKG R94.31  History:         Patient has no prior history of Echocardiogram examinations.  Sonographer:     Nathanel Devonshire Referring Phys:  [id number] SARA-MAIZ A THOMAS Diagnosing Phys: Darryle Decent MD  Sonographer Comments: Technically challenging study due to limited acoustic windows. Image acquisition challenging due to uncooperative patient. This exam had to be aborted because the paitent became uncooperative. IMPRESSIONS  1. Exam terminated due to the patient not being cooperative. On limited evaluation, LVEF 50-55%. RV function appears normal.  2. Left ventricular ejection fraction, by estimation, is 50 to 55%. The left ventricle has low normal function. Left ventricular endocardial border not optimally defined to evaluate regional wall motion.  3. Right ventricular systolic function is normal. The right ventricular size is normal.  4. The aortic valve is tricuspid. Aortic valve regurgitation is not visualized. No aortic stenosis is present. FINDINGS  Left Ventricle: Left ventricular ejection fraction, by estimation, is 50 to 55%. The left ventricle has low normal function. Left ventricular endocardial border not optimally defined to evaluate regional wall motion. The left ventricular internal cavity  size was normal in size. There is no left ventricular hypertrophy. Right Ventricle: The right ventricular size is normal. No increase in right ventricular wall thickness. Right  ventricular systolic function is normal. Pericardium: There is no evidence of pericardial effusion. Tricuspid Valve: The tricuspid valve is grossly normal. Tricuspid valve regurgitation is trivial. No evidence of tricuspid stenosis. Aortic Valve: The aortic valve is tricuspid. Aortic valve regurgitation is not visualized. No aortic stenosis is present. Pulmonic Valve: The pulmonic valve was grossly normal. Pulmonic valve regurgitation is not visualized. No evidence of pulmonic stenosis.  LEFT VENTRICLE PLAX 2D LVIDd:         5.70 cm      Diastology LVIDs:         3.60 cm  LV e' medial:   12.50 cm/s LV PW:         0.80 cm      LV E/e' medial: 5.1 LV IVS:        0.90 cm LVOT diam:     1.90 cm LVOT Area:     2.84 cm  LV Volumes (MOD) LV vol d, MOD A4C: 119.0 ml LV vol s, MOD A4C: 44.3 ml LV SV MOD A4C:     119.0 ml LEFT ATRIUM         Index LA diam:    2.80 cm 1.64 cm/m                        PULMONIC VALVE AORTA                 PV Vmax:       1.40 m/s Ao Root diam: 2.60 cm PV Peak grad:  7.8 mmHg  MITRAL VALVE MV Area (PHT): 3.44 cm    SHUNTS MV E velocity: 63.40 cm/s  Systemic Diam: 1.90 cm MV A velocity: 29.60 cm/s MV E/A ratio:  2.14 Darryle Decent MD Electronically signed by Darryle Decent MD Signature Date/Time: 06/18/2024/3:07:14 PM    Final (Updated)    CT Head Wo Contrast Result Date: 06/17/2024 EXAM: CT HEAD WITHOUT CONTRAST 06/17/2024 08:11:53 PM TECHNIQUE: CT of the head was performed without the administration of intravenous contrast. Automated exposure control, iterative reconstruction, and/or weight based adjustment of the mA/kV was utilized to reduce the radiation dose to as low as reasonably achievable. COMPARISON: None available. CLINICAL HISTORY: Mental status change, unknown cause. FINDINGS: BRAIN AND VENTRICLES: No acute hemorrhage. No evidence of acute infarct. No hydrocephalus. No extra-axial collection. No mass effect or midline shift. ORBITS: No acute abnormality. SINUSES: No acute  abnormality. SOFT TISSUES AND SKULL: No acute soft tissue abnormality. No skull fracture. IMPRESSION: 1. No acute intracranial abnormality. Electronically signed by: Pinkie Pebbles MD 06/17/2024 08:16 PM EST RP Workstation: [id number]   Labs:   Basic Metabolic Panel: Recent Labs  Lab 06/17/24 1735 06/18/24 1050 06/19/24 0507  NA 142 141 139  K 4.4 4.1 4.2  CL 102 100 101  CO2 27 29 28   GLUCOSE 98 116* 101*  BUN 10 16 16   CREATININE 0.92 0.95 1.00  CALCIUM 9.7 10.0 9.4  MG 2.3  --   --      CBC: Recent Labs  Lab 06/17/24 1735 06/18/24 1048 06/19/24 0507  WBC 6.6 8.7 5.4  NEUTROABS 4.7  --   --   HGB 13.5 14.2 13.1  HCT 39.8 41.5 38.0*  MCV 92.3 91.8 91.3  PLT 258 266 255         SIGNED:   True Atlas, MD  [facility] 06/19/2024, 2:24 PM Time taking on discharge: 50 minutes
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
## 2024-06-27 ENCOUNTER — Ambulatory Visit (INDEPENDENT_AMBULATORY_CARE_PROVIDER_SITE_OTHER): Payer: Self-pay | Admitting: Psychiatry

## 2024-06-27 ENCOUNTER — Encounter (HOSPITAL_COMMUNITY): Payer: Self-pay | Admitting: Psychiatry

## 2024-06-27 DIAGNOSIS — F411 Generalized anxiety disorder: Secondary | ICD-10-CM | POA: Diagnosis not present

## 2024-06-27 DIAGNOSIS — F1994 Other psychoactive substance use, unspecified with psychoactive substance-induced mood disorder: Secondary | ICD-10-CM | POA: Diagnosis not present

## 2024-06-27 MED ORDER — QUETIAPINE FUMARATE 25 MG PO TABS: 25.0000 mg | ORAL_TABLET | Freq: Every day | ORAL | 3 refills | Status: AC

## 2024-06-27 MED ORDER — GABAPENTIN 300 MG PO CAPS: 300.0000 mg | ORAL_CAPSULE | Freq: Three times a day (TID) | ORAL | 3 refills | Status: AC

## 2024-06-27 MED ORDER — SERTRALINE HCL 100 MG PO TABS: 100.0000 mg | ORAL_TABLET | Freq: Every day | ORAL | 3 refills | Status: AC

## 2024-06-27 NOTE — Progress Notes (Signed)
 " Psychiatric Initial Adult Assessment  Virtual Visit via Video Note  I connected with Charles Mahoney on 06/27/24 at  2:00 PM EST by a video enabled telemedicine application and verified that I am speaking with the correct person using two identifiers.  Location: Patient: Home Provider: Clinic   I discussed the limitations of evaluation and management by telemedicine and the availability of in person appointments. The patient expressed understanding and agreed to proceed.  I provided 45 minutes of non-face-to-face time during this encounter.    Patient Identification: Charles Mahoney MRN:  [id number] Date of Evaluation:  06/27/2024 Referral Source: Charles Mahoney Chief Complaint:   I need to restart medications Visit Diagnosis:    ICD-10-CM   1. Substance induced mood disorder (HCC)  F19.94 sertraline  (ZOLOFT ) 100 MG tablet    QUEtiapine  (SEROQUEL ) 25 MG tablet    gabapentin  (NEURONTIN ) 300 MG capsule    Ambulatory referral to Social Work    2. Generalized anxiety disorder  F41.1 sertraline  (ZOLOFT ) 100 MG tablet    QUEtiapine  (SEROQUEL ) 25 MG tablet    gabapentin  (NEURONTIN ) 300 MG capsule      History of Present Illness:  34 year old male seen today for initial psychiatric evaluation.  He was referred to outpatient psychiatry but Charles Mahoney when he presented on 06/17/2024 through 06/19/2024 for substance intoxication.  Patient's UDS was positive for benzos and marijuana.  Per chart review patient may have been withdrawing from Suboxone .  Patient has a psychiatric history of ADHD, substance use (alcohol, marijuana, and opioids).  Patient is currently prescribed Klonopin  0.5 mg daily as needed, gabapentin  600 mg 3 times daily as needed, Buprenorphine  HCl-Naloxone  HCl 8-2 MG twice daily, Nicorette 21 mg patches, and Zoloft  100 mg daily.  He informed clinical research associate that he has not been taking his medications since his discharge and reports that he ran out of refills prior to his admission.  Today  he is well-groomed, pleasant, cooperative, and engaged in conversation.  He inform clinical research associate that he needs to restart medication.  He notes that he has not been focusing on his mental health but notes that now he would like to restart medications.  Patient notes that he has several legal issues related to his substance use.  He notes that his license is suspended and reports that his ex-wife has taken legal actions against him.  Patient notes that he he has to go to therapy in order to see his children again.  Provider referred patient to outpatient counseling for therapy.  Patient notes that his substance of choice is opioids.  Patient denied other substance use but his UDS was positive for benzos and marijuana.  Patient describes being irritable, distractible, having racing thoughts, and fluctuations in mood.  He denies current hallucinations but reports that after his discharge he heard voices.  Patient reports that his anxiety and depression has been increased.  At times he reports  that he worries about his children, his daughter, and pending court cases, and getting a job.  Today provider conducted GAD-7 and patient scored a 13. Provide also conducted PHQ-9 patient scored a 17.  He reports that his sleep is poor noting he sleeps 3 to 4 hours nightly.  Today he denies SI/HI/AVH or paranoia.   Today patient agreeable to starting Seroquel  25 mg to help manage sleep, anxiety, depression.  Gabapentin  600 mg 3 times daily reduced to 300 mg 3 times daily as patient has not had any few weeks.  He will continue his  other medication as prescribed.  Patient  reports that he will follow-up with [facility] online psychiatry.  Patient referred to outpatient counseling for therapy.  Associated Signs/Symptoms: Depression Symptoms:  depressed mood, anhedonia, insomnia, psychomotor agitation, feelings of worthlessness/guilt, difficulty concentrating, anxiety, (Hypo) Manic Symptoms:  Distractibility, Elevated  Mood, Flight of Ideas, Irritable Mood, Anxiety Symptoms:  Excessive Worry, Psychotic Symptoms:  Denies PTSD Symptoms: NA  Past Psychiatric History: ADHD, Depression, Substance induced mood disorder (opioids and marijuana),  Previous Psychotropic Medications: Zoloft , Lexapro, Klonopin , nicotine  patch, gabapentin  Prozac , trazodone, Seroquel    Substance Abuse History in the last 12 months:  Yes.    Consequences of Substance Abuse: Medical Consequences:  Admitted Jan 2026 Legal Consequences:  License taken away.   Past Medical History:  Past Medical History:  Diagnosis Date   ADHD (attention deficit hyperactivity disorder)    Anxiety    No past surgical history on file.  Family Psychiatric History: Father anxiety and depression and maternal grandmother anxiety and depression  Family History:  Family History  Problem Relation Age of Onset   Depression Father    Mental retardation Father        anxiety (Klonopin )   Depression Brother    Mental retardation Brother        anxiety    Social History:   Social History   Socioeconomic History   Marital status: Single    Spouse name: Not on file   Number of children: Not on file   Years of education: Not on file   Highest education level: Not on file  Occupational History   Not on file  Tobacco Use   Smoking status: Never   Smokeless tobacco: Not on file  Substance and Sexual Activity   Alcohol use: Yes    Comment: social   Drug use: No   Sexual activity: Not on file  Other Topics Concern   Not on file  Social History Narrative   Marital status:  Single       Children:  None       Tobacco: socially       Alcohol:  Weekends; DUIs x 1 in 2015      Drugs:  None      Exercise: six days per week   Social Drivers of Health   Tobacco Use: Unknown (06/17/2024)   Patient History    Smoking Tobacco Use: Never    Smokeless Tobacco Use: Unknown    Passive Exposure: Not on file  Financial Resource Strain: Not on file   Food Insecurity: No Food Insecurity (06/18/2024)   Epic    Worried About Programme Researcher, Broadcasting/film/video in the Last Year: Never true    Ran Out of Food in the Last Year: Never true  Transportation Needs: No Transportation Needs (06/18/2024)   Epic    Lack of Transportation (Medical): No    Lack of Transportation (Non-Medical): No  Physical Activity: Not on file  Stress: Not on file  Social Connections: Not on file  Depression (EYV7-0): High Risk (06/27/2024)   Depression (PHQ2-9)    PHQ-2 Score: 17  Alcohol Screen: Not on file  Housing: Low Risk (06/18/2024)   Epic    Unable to Pay for Housing in the Last Year: No    Number of Times Moved in the Last Year: 0    Homeless in the Last Year: No  Utilities: Not At Risk (06/18/2024)   Epic    Threatened with loss of utilities: No  Health Literacy: Not  on file    Additional Social History: Patient is separated. He has two children (2 and 4). He is currently unemployed. He denies substance use but his UDS was positive marijuana.  Patient also uses cigarettes.  Allergies:  Allergies[1]  Metabolic Disorder Labs: Lab Results  Component Value Date   HGBA1C 5.7 (H) 06/18/2024   MPG 116.89 06/18/2024   No results found for: PROLACTIN No results found for: CHOL, TRIG, HDL, CHOLHDL, VLDL, LDLCALC Lab Results  Component Value Date   TSH 0.858 06/18/2024    Therapeutic Level Labs: No results found for: LITHIUM No results found for: CBMZ No results found for: VALPROATE  Current Medications: Current Outpatient Medications  Medication Sig Dispense Refill   gabapentin  (NEURONTIN ) 300 MG capsule Take 1 capsule (300 mg total) by mouth 3 (three) times daily. 90 capsule 3   QUEtiapine  (SEROQUEL ) 25 MG tablet Take 1 tablet (25 mg total) by mouth at bedtime. 30 tablet 3   Buprenorphine  HCl-Naloxone  HCl 8-2 MG FILM Place 1 Film under the tongue 2 (two) times daily.     clonazePAM  (KLONOPIN ) 0.5 MG tablet Take 0.5 mg by mouth daily as  needed for anxiety. (Patient not taking: Reported on 06/18/2024)     folic acid  (FOLVITE ) 1 MG tablet Take 1 tablet (1 mg total) by mouth daily. 30 tablet 0   Multiple Vitamin (MULTIVITAMIN WITH MINERALS) TABS tablet Take 1 tablet by mouth daily.     nicotine  (NICODERM CQ  - DOSED IN MG/24 HOURS) 21 mg/24hr patch Place 1 patch (21 mg total) onto the skin daily. 14 patch 0   NON FORMULARY Clobromazolam or Phenazolam 0.5 mg     sertraline  (ZOLOFT ) 100 MG tablet Take 1 tablet (100 mg total) by mouth daily. 30 tablet 3   thiamine  (VITAMIN B-1) 100 MG tablet Take 1 tablet (100 mg total) by mouth daily. 30 tablet 0   No current facility-administered medications for this visit.    Musculoskeletal: Strength & Muscle Tone: within normal limits and telehealth visit Gait & Station: normal, telehealth visit Patient leans: N/A  Psychiatric Specialty Exam: Review of Systems  There were no vitals taken for this visit.There is no height or weight on file to calculate BMI.  General Appearance: Well Groomed  Eye Contact:  Good  Speech:  Clear and Coherent and Normal Rate  Volume:  Normal  Mood:  Anxious and Depressed  Affect:  Appropriate and Congruent  Thought Process:  Coherent, Goal Directed, and Linear  Orientation:  Full (Time, Place, and Person)  Thought Content:  WDL and Logical  Suicidal Thoughts:  No  Homicidal Thoughts:  No  Memory:  Immediate;   Good Recent;   Good Remote;   Good  Judgement:  Fair  Insight:  Good  Psychomotor Activity:  Normal  Concentration:  Concentration: Good and Attention Span: Good  Recall:  Good  Fund of Knowledge:Good  Language: Good  Akathisia:  No  Handed:  Right  AIMS (if indicated):  not done  Assets:  Communication Skills Desire for Improvement Housing Leisure Time Physical Health Social Support Vocational/Educational  ADL's:  Intact  Cognition: WNL  Sleep:  Fair   Screenings: GAD-7    Flowsheet Row Office Visit from 06/27/2024 in [facility] Office Visit from 06/29/2015 in Primary Care at [facility]  Total GAD-7 Score 13 7   PHQ2-9    Flowsheet Row Office Visit from 06/27/2024 in [facility] Office Visit from 06/29/2015 in Primary Care  at [facility] Total Score 4 0  PHQ-9 Total Score 17 --   Flowsheet Row ED to Hosp-Admission (Discharged) from 06/17/2024 in [facility] MEDICAL UNIT  C-SSRS RISK CATEGORY No Risk    Assessment and Plan: Patient endorses increased anxiety, depression, insomnia, and marijuana use.  He notes that his mood fluctuates, he is irritable, distractible.  Today patient agreeable to starting Seroquel  25 mg to help manage sleep, anxiety, depression.  Gabapentin  600 mg 3 times daily reduced to 300 mg 3 times daily as patient has not had any few weeks.  He will continue his other medication as prescribed.  Patient  reports that he will follow-up with [facility] online psychiatry.  Patient referred to outpatient counseling for therapy.  1. Substance induced mood disorder (HCC) (Primary)  Continue- sertraline  (ZOLOFT ) 100 MG tablet; Take 1 tablet (100 mg total) by mouth daily.  Dispense: 30 tablet; Refill: 3 Start- QUEtiapine  (SEROQUEL ) 25 MG tablet; Take 1 tablet (25 mg total) by mouth at bedtime.  Dispense: 30 tablet; Refill: 3 Reduced- gabapentin  (NEURONTIN ) 300 MG capsule; Take 1 capsule (300 mg total) by mouth 3 (three) times daily.  Dispense: 90 capsule; Refill: 3 - Ambulatory referral to Social Work  2. Generalized anxiety disorder  Continue- sertraline  (ZOLOFT ) 100 MG tablet; Take 1 tablet (100 mg total) by mouth daily.  Dispense: 30 tablet; Refill: 3 Start- QUEtiapine  (SEROQUEL ) 25 MG tablet; Take 1 tablet (25 mg total) by mouth at bedtime.  Dispense: 30 tablet; Refill: 3 Reduced- gabapentin  (NEURONTIN ) 300 MG capsule; Take 1 capsule (300 mg total) by mouth 3 (three) times daily.  Dispense: 90 capsule; Refill:  3  Collaboration of Care: Other provider involved in patient's care AEB primary psychiatric provider  Patient/Guardian was advised Release of Information must be obtained prior to any record release in order to collaborate their care with an outside provider. Patient/Guardian was advised if they have not already done so to contact the registration department to sign all necessary forms in order for us  to release information regarding their care.   Consent: Patient/Guardian gives verbal consent for treatment and assignment of benefits for services provided during this visit. Patient/Guardian expressed understanding and agreed to proceed.   Follow-up as needed Follow-up with therapy  Zane FORBES Bach, NP 1/27/202612:29 PM     [1] No Known Allergies  "

## 2024-08-03 ENCOUNTER — Ambulatory Visit (HOSPITAL_COMMUNITY)
